# Patient Record
Sex: Female | Born: 1945 | Race: White | Hispanic: No | Smoking: Never smoker
Health system: Southern US, Community
[De-identification: ages and names within clinical notes are randomized; demographics above are authoritative.]

## PROBLEM LIST (undated history)

## (undated) DIAGNOSIS — G20 Parkinson's disease: Secondary | ICD-10-CM

## (undated) DIAGNOSIS — G20A1 Parkinson's disease without dyskinesia, without mention of fluctuations: Secondary | ICD-10-CM

## (undated) HISTORY — PX: MENISCUS REPAIR: SHX5179

## (undated) HISTORY — PX: TONSILLECTOMY: SUR1361

## (undated) HISTORY — PX: FOOT FRACTURE SURGERY: SHX645

## (undated) HISTORY — PX: LAMINECTOMY: SHX219

## (undated) HISTORY — PX: BACK SURGERY: SHX140

## (undated) HISTORY — PX: ROTATOR CUFF REPAIR: SHX139

## (undated) HISTORY — PX: ABDOMINAL HYSTERECTOMY: SHX81

---

## 1998-06-08 ENCOUNTER — Other Ambulatory Visit: Admission: RE | Admit: 1998-06-08 | Discharge: 1998-06-08 | Payer: Self-pay | Admitting: *Deleted

## 1999-07-14 ENCOUNTER — Other Ambulatory Visit: Admission: RE | Admit: 1999-07-14 | Discharge: 1999-07-14 | Payer: Self-pay | Admitting: *Deleted

## 2000-07-13 ENCOUNTER — Other Ambulatory Visit: Admission: RE | Admit: 2000-07-13 | Discharge: 2000-07-13 | Payer: Self-pay | Admitting: *Deleted

## 2001-09-06 ENCOUNTER — Other Ambulatory Visit: Admission: RE | Admit: 2001-09-06 | Discharge: 2001-09-06 | Payer: Self-pay | Admitting: Obstetrics and Gynecology

## 2003-04-15 ENCOUNTER — Other Ambulatory Visit: Admission: RE | Admit: 2003-04-15 | Discharge: 2003-04-15 | Payer: Self-pay | Admitting: Obstetrics and Gynecology

## 2009-05-11 ENCOUNTER — Emergency Department: Payer: Self-pay | Admitting: Emergency Medicine

## 2009-05-27 ENCOUNTER — Encounter: Admission: RE | Admit: 2009-05-27 | Discharge: 2009-05-27 | Payer: Self-pay | Admitting: Orthopedic Surgery

## 2011-06-08 ENCOUNTER — Other Ambulatory Visit: Payer: Self-pay | Admitting: Family Medicine

## 2011-06-08 ENCOUNTER — Ambulatory Visit
Admission: RE | Admit: 2011-06-08 | Discharge: 2011-06-08 | Disposition: A | Discharge status: Home / Self Care | Payer: Medicare Other | Source: Ambulatory Visit | Attending: Family Medicine | Admitting: Family Medicine

## 2011-06-08 DIAGNOSIS — R609 Edema, unspecified: Secondary | ICD-10-CM

## 2011-07-10 ENCOUNTER — Other Ambulatory Visit: Payer: Self-pay | Admitting: Family Medicine

## 2011-07-10 ENCOUNTER — Ambulatory Visit
Admission: RE | Admit: 2011-07-10 | Discharge: 2011-07-10 | Disposition: A | Discharge status: Home / Self Care | Payer: Medicare Other | Source: Ambulatory Visit | Attending: Family Medicine | Admitting: Family Medicine

## 2011-07-10 DIAGNOSIS — M79661 Pain in right lower leg: Secondary | ICD-10-CM

## 2012-04-24 ENCOUNTER — Ambulatory Visit: Payer: Medicare Other | Attending: Orthopedic Surgery | Admitting: Physical Therapy

## 2012-04-24 DIAGNOSIS — IMO0001 Reserved for inherently not codable concepts without codable children: Secondary | ICD-10-CM | POA: Insufficient documentation

## 2012-04-24 DIAGNOSIS — M25669 Stiffness of unspecified knee, not elsewhere classified: Secondary | ICD-10-CM | POA: Insufficient documentation

## 2012-04-24 DIAGNOSIS — R269 Unspecified abnormalities of gait and mobility: Secondary | ICD-10-CM | POA: Insufficient documentation

## 2012-04-24 DIAGNOSIS — M25569 Pain in unspecified knee: Secondary | ICD-10-CM | POA: Insufficient documentation

## 2012-04-29 ENCOUNTER — Ambulatory Visit: Payer: Medicare Other | Admitting: Physical Therapy

## 2012-05-02 ENCOUNTER — Ambulatory Visit: Payer: Medicare Other | Admitting: Physical Therapy

## 2012-05-06 ENCOUNTER — Ambulatory Visit: Payer: Medicare Other | Admitting: Physical Therapy

## 2012-05-10 ENCOUNTER — Ambulatory Visit: Payer: Medicare Other | Admitting: Physical Therapy

## 2012-05-14 ENCOUNTER — Ambulatory Visit: Payer: Medicare Other | Admitting: Physical Therapy

## 2012-05-16 ENCOUNTER — Ambulatory Visit: Payer: Medicare Other | Attending: Orthopedic Surgery | Admitting: Physical Therapy

## 2012-05-16 DIAGNOSIS — IMO0001 Reserved for inherently not codable concepts without codable children: Secondary | ICD-10-CM | POA: Insufficient documentation

## 2012-05-16 DIAGNOSIS — M25669 Stiffness of unspecified knee, not elsewhere classified: Secondary | ICD-10-CM | POA: Insufficient documentation

## 2012-05-16 DIAGNOSIS — M25569 Pain in unspecified knee: Secondary | ICD-10-CM | POA: Insufficient documentation

## 2012-05-16 DIAGNOSIS — R269 Unspecified abnormalities of gait and mobility: Secondary | ICD-10-CM | POA: Insufficient documentation

## 2012-05-21 ENCOUNTER — Ambulatory Visit: Payer: Medicare Other | Admitting: Physical Therapy

## 2012-05-23 ENCOUNTER — Ambulatory Visit: Payer: Medicare Other | Admitting: Physical Therapy

## 2012-05-28 ENCOUNTER — Ambulatory Visit: Payer: Medicare Other | Admitting: Physical Therapy

## 2012-05-30 ENCOUNTER — Ambulatory Visit: Payer: Medicare Other | Admitting: Physical Therapy

## 2012-06-04 ENCOUNTER — Ambulatory Visit: Payer: Medicare Other | Admitting: Physical Therapy

## 2012-06-06 ENCOUNTER — Ambulatory Visit: Payer: Medicare Other | Admitting: Physical Therapy

## 2012-06-11 ENCOUNTER — Ambulatory Visit: Payer: Medicare Other | Admitting: Physical Therapy

## 2012-06-13 ENCOUNTER — Ambulatory Visit: Payer: Medicare Other | Admitting: Physical Therapy

## 2012-06-18 ENCOUNTER — Ambulatory Visit: Payer: Medicare Other | Attending: Orthopedic Surgery | Admitting: Physical Therapy

## 2012-06-18 DIAGNOSIS — M25569 Pain in unspecified knee: Secondary | ICD-10-CM | POA: Insufficient documentation

## 2012-06-18 DIAGNOSIS — M25669 Stiffness of unspecified knee, not elsewhere classified: Secondary | ICD-10-CM | POA: Insufficient documentation

## 2012-06-18 DIAGNOSIS — IMO0001 Reserved for inherently not codable concepts without codable children: Secondary | ICD-10-CM | POA: Insufficient documentation

## 2012-06-18 DIAGNOSIS — R269 Unspecified abnormalities of gait and mobility: Secondary | ICD-10-CM | POA: Insufficient documentation

## 2012-06-20 ENCOUNTER — Ambulatory Visit: Payer: Medicare Other | Admitting: Physical Therapy

## 2012-06-24 ENCOUNTER — Ambulatory Visit: Payer: Medicare Other | Admitting: Physical Therapy

## 2012-06-27 ENCOUNTER — Ambulatory Visit: Payer: Medicare Other | Admitting: Physical Therapy

## 2014-01-28 DIAGNOSIS — H40019 Open angle with borderline findings, low risk, unspecified eye: Secondary | ICD-10-CM | POA: Diagnosis not present

## 2014-01-28 DIAGNOSIS — H43313 Vitreous membranes and strands, bilateral: Secondary | ICD-10-CM | POA: Diagnosis not present

## 2014-06-18 DIAGNOSIS — Z1231 Encounter for screening mammogram for malignant neoplasm of breast: Secondary | ICD-10-CM | POA: Diagnosis not present

## 2014-08-11 DIAGNOSIS — F329 Major depressive disorder, single episode, unspecified: Secondary | ICD-10-CM | POA: Diagnosis not present

## 2014-08-11 DIAGNOSIS — Z6832 Body mass index (BMI) 32.0-32.9, adult: Secondary | ICD-10-CM | POA: Diagnosis not present

## 2014-08-11 DIAGNOSIS — E669 Obesity, unspecified: Secondary | ICD-10-CM | POA: Diagnosis not present

## 2014-08-11 DIAGNOSIS — E785 Hyperlipidemia, unspecified: Secondary | ICD-10-CM | POA: Diagnosis not present

## 2014-08-11 DIAGNOSIS — G20 Parkinson's disease: Secondary | ICD-10-CM | POA: Diagnosis not present

## 2014-08-18 DIAGNOSIS — M1712 Unilateral primary osteoarthritis, left knee: Secondary | ICD-10-CM | POA: Diagnosis not present

## 2014-08-18 DIAGNOSIS — M19032 Primary osteoarthritis, left wrist: Secondary | ICD-10-CM | POA: Diagnosis not present

## 2014-09-10 DIAGNOSIS — D485 Neoplasm of uncertain behavior of skin: Secondary | ICD-10-CM | POA: Diagnosis not present

## 2014-09-10 DIAGNOSIS — Z85828 Personal history of other malignant neoplasm of skin: Secondary | ICD-10-CM | POA: Diagnosis not present

## 2014-09-10 DIAGNOSIS — C4492 Squamous cell carcinoma of skin, unspecified: Secondary | ICD-10-CM | POA: Diagnosis not present

## 2014-09-10 DIAGNOSIS — D225 Melanocytic nevi of trunk: Secondary | ICD-10-CM | POA: Diagnosis not present

## 2014-09-10 DIAGNOSIS — L821 Other seborrheic keratosis: Secondary | ICD-10-CM | POA: Diagnosis not present

## 2014-10-02 DIAGNOSIS — C44321 Squamous cell carcinoma of skin of nose: Secondary | ICD-10-CM | POA: Diagnosis not present

## 2014-10-17 DIAGNOSIS — R42 Dizziness and giddiness: Secondary | ICD-10-CM | POA: Diagnosis not present

## 2014-11-06 DIAGNOSIS — Z23 Encounter for immunization: Secondary | ICD-10-CM | POA: Diagnosis not present

## 2014-12-29 DIAGNOSIS — M25562 Pain in left knee: Secondary | ICD-10-CM | POA: Diagnosis not present

## 2014-12-29 DIAGNOSIS — M170 Bilateral primary osteoarthritis of knee: Secondary | ICD-10-CM | POA: Diagnosis not present

## 2014-12-29 DIAGNOSIS — R262 Difficulty in walking, not elsewhere classified: Secondary | ICD-10-CM | POA: Diagnosis not present

## 2014-12-29 DIAGNOSIS — M25561 Pain in right knee: Secondary | ICD-10-CM | POA: Diagnosis not present

## 2014-12-29 DIAGNOSIS — M19031 Primary osteoarthritis, right wrist: Secondary | ICD-10-CM | POA: Diagnosis not present

## 2015-01-05 DIAGNOSIS — M25561 Pain in right knee: Secondary | ICD-10-CM | POA: Diagnosis not present

## 2015-01-05 DIAGNOSIS — M25562 Pain in left knee: Secondary | ICD-10-CM | POA: Diagnosis not present

## 2015-01-05 DIAGNOSIS — M1712 Unilateral primary osteoarthritis, left knee: Secondary | ICD-10-CM | POA: Diagnosis not present

## 2015-01-05 DIAGNOSIS — M170 Bilateral primary osteoarthritis of knee: Secondary | ICD-10-CM | POA: Diagnosis not present

## 2015-01-05 DIAGNOSIS — R2689 Other abnormalities of gait and mobility: Secondary | ICD-10-CM | POA: Diagnosis not present

## 2015-01-07 DIAGNOSIS — M25562 Pain in left knee: Secondary | ICD-10-CM | POA: Diagnosis not present

## 2015-01-07 DIAGNOSIS — M170 Bilateral primary osteoarthritis of knee: Secondary | ICD-10-CM | POA: Diagnosis not present

## 2015-01-07 DIAGNOSIS — R2689 Other abnormalities of gait and mobility: Secondary | ICD-10-CM | POA: Diagnosis not present

## 2015-01-07 DIAGNOSIS — M1711 Unilateral primary osteoarthritis, right knee: Secondary | ICD-10-CM | POA: Diagnosis not present

## 2015-01-07 DIAGNOSIS — M25561 Pain in right knee: Secondary | ICD-10-CM | POA: Diagnosis not present

## 2015-01-20 DIAGNOSIS — M1712 Unilateral primary osteoarthritis, left knee: Secondary | ICD-10-CM | POA: Diagnosis not present

## 2015-01-20 DIAGNOSIS — M25562 Pain in left knee: Secondary | ICD-10-CM | POA: Diagnosis not present

## 2015-01-20 DIAGNOSIS — R2689 Other abnormalities of gait and mobility: Secondary | ICD-10-CM | POA: Diagnosis not present

## 2015-01-20 DIAGNOSIS — M25561 Pain in right knee: Secondary | ICD-10-CM | POA: Diagnosis not present

## 2015-01-20 DIAGNOSIS — M170 Bilateral primary osteoarthritis of knee: Secondary | ICD-10-CM | POA: Diagnosis not present

## 2015-02-02 DIAGNOSIS — M25562 Pain in left knee: Secondary | ICD-10-CM | POA: Diagnosis not present

## 2015-02-02 DIAGNOSIS — M1711 Unilateral primary osteoarthritis, right knee: Secondary | ICD-10-CM | POA: Diagnosis not present

## 2015-02-02 DIAGNOSIS — M170 Bilateral primary osteoarthritis of knee: Secondary | ICD-10-CM | POA: Diagnosis not present

## 2015-02-02 DIAGNOSIS — R2689 Other abnormalities of gait and mobility: Secondary | ICD-10-CM | POA: Diagnosis not present

## 2015-02-02 DIAGNOSIS — M25561 Pain in right knee: Secondary | ICD-10-CM | POA: Diagnosis not present

## 2015-02-04 DIAGNOSIS — M859 Disorder of bone density and structure, unspecified: Secondary | ICD-10-CM | POA: Diagnosis not present

## 2015-02-04 DIAGNOSIS — E7800 Pure hypercholesterolemia, unspecified: Secondary | ICD-10-CM | POA: Diagnosis not present

## 2015-02-04 DIAGNOSIS — Z23 Encounter for immunization: Secondary | ICD-10-CM | POA: Diagnosis not present

## 2015-02-04 DIAGNOSIS — Z209 Contact with and (suspected) exposure to unspecified communicable disease: Secondary | ICD-10-CM | POA: Diagnosis not present

## 2015-02-04 DIAGNOSIS — E669 Obesity, unspecified: Secondary | ICD-10-CM | POA: Diagnosis not present

## 2015-02-04 DIAGNOSIS — Z0000 Encounter for general adult medical examination without abnormal findings: Secondary | ICD-10-CM | POA: Diagnosis not present

## 2015-02-04 DIAGNOSIS — Z131 Encounter for screening for diabetes mellitus: Secondary | ICD-10-CM | POA: Diagnosis not present

## 2015-02-04 DIAGNOSIS — Z136 Encounter for screening for cardiovascular disorders: Secondary | ICD-10-CM | POA: Diagnosis not present

## 2015-02-04 DIAGNOSIS — G20 Parkinson's disease: Secondary | ICD-10-CM | POA: Diagnosis not present

## 2015-02-05 DIAGNOSIS — M25562 Pain in left knee: Secondary | ICD-10-CM | POA: Diagnosis not present

## 2015-02-05 DIAGNOSIS — M1712 Unilateral primary osteoarthritis, left knee: Secondary | ICD-10-CM | POA: Diagnosis not present

## 2015-02-05 DIAGNOSIS — M170 Bilateral primary osteoarthritis of knee: Secondary | ICD-10-CM | POA: Diagnosis not present

## 2015-02-05 DIAGNOSIS — M25561 Pain in right knee: Secondary | ICD-10-CM | POA: Diagnosis not present

## 2015-02-05 DIAGNOSIS — R2689 Other abnormalities of gait and mobility: Secondary | ICD-10-CM | POA: Diagnosis not present

## 2015-02-09 DIAGNOSIS — M25561 Pain in right knee: Secondary | ICD-10-CM | POA: Diagnosis not present

## 2015-02-09 DIAGNOSIS — R2689 Other abnormalities of gait and mobility: Secondary | ICD-10-CM | POA: Diagnosis not present

## 2015-02-09 DIAGNOSIS — M25562 Pain in left knee: Secondary | ICD-10-CM | POA: Diagnosis not present

## 2015-02-09 DIAGNOSIS — M170 Bilateral primary osteoarthritis of knee: Secondary | ICD-10-CM | POA: Diagnosis not present

## 2015-02-09 DIAGNOSIS — M1711 Unilateral primary osteoarthritis, right knee: Secondary | ICD-10-CM | POA: Diagnosis not present

## 2015-02-10 DIAGNOSIS — R635 Abnormal weight gain: Secondary | ICD-10-CM | POA: Diagnosis not present

## 2015-02-10 DIAGNOSIS — G20 Parkinson's disease: Secondary | ICD-10-CM | POA: Diagnosis not present

## 2015-02-10 DIAGNOSIS — G249 Dystonia, unspecified: Secondary | ICD-10-CM | POA: Diagnosis not present

## 2015-02-10 DIAGNOSIS — Z79899 Other long term (current) drug therapy: Secondary | ICD-10-CM | POA: Diagnosis not present

## 2015-02-10 DIAGNOSIS — F329 Major depressive disorder, single episode, unspecified: Secondary | ICD-10-CM | POA: Diagnosis not present

## 2015-02-17 DIAGNOSIS — M25562 Pain in left knee: Secondary | ICD-10-CM | POA: Diagnosis not present

## 2015-02-17 DIAGNOSIS — M25561 Pain in right knee: Secondary | ICD-10-CM | POA: Diagnosis not present

## 2015-02-17 DIAGNOSIS — R2689 Other abnormalities of gait and mobility: Secondary | ICD-10-CM | POA: Diagnosis not present

## 2015-02-17 DIAGNOSIS — M170 Bilateral primary osteoarthritis of knee: Secondary | ICD-10-CM | POA: Diagnosis not present

## 2015-02-17 DIAGNOSIS — M1712 Unilateral primary osteoarthritis, left knee: Secondary | ICD-10-CM | POA: Diagnosis not present

## 2015-02-23 DIAGNOSIS — M1711 Unilateral primary osteoarthritis, right knee: Secondary | ICD-10-CM | POA: Diagnosis not present

## 2015-02-23 DIAGNOSIS — R2689 Other abnormalities of gait and mobility: Secondary | ICD-10-CM | POA: Diagnosis not present

## 2015-02-23 DIAGNOSIS — M25561 Pain in right knee: Secondary | ICD-10-CM | POA: Diagnosis not present

## 2015-02-23 DIAGNOSIS — M25562 Pain in left knee: Secondary | ICD-10-CM | POA: Diagnosis not present

## 2015-02-23 DIAGNOSIS — M170 Bilateral primary osteoarthritis of knee: Secondary | ICD-10-CM | POA: Diagnosis not present

## 2015-03-08 DIAGNOSIS — H43313 Vitreous membranes and strands, bilateral: Secondary | ICD-10-CM | POA: Diagnosis not present

## 2015-03-08 DIAGNOSIS — H40019 Open angle with borderline findings, low risk, unspecified eye: Secondary | ICD-10-CM | POA: Diagnosis not present

## 2015-05-06 DIAGNOSIS — S46921A Laceration of unspecified muscle, fascia and tendon at shoulder and upper arm level, right arm, initial encounter: Secondary | ICD-10-CM | POA: Diagnosis not present

## 2015-05-06 DIAGNOSIS — R110 Nausea: Secondary | ICD-10-CM | POA: Diagnosis not present

## 2015-05-06 DIAGNOSIS — S41109A Unspecified open wound of unspecified upper arm, initial encounter: Secondary | ICD-10-CM | POA: Diagnosis not present

## 2015-05-06 DIAGNOSIS — G20 Parkinson's disease: Secondary | ICD-10-CM | POA: Diagnosis not present

## 2015-05-06 DIAGNOSIS — T148 Other injury of unspecified body region: Secondary | ICD-10-CM | POA: Diagnosis not present

## 2015-05-06 DIAGNOSIS — S41111A Laceration without foreign body of right upper arm, initial encounter: Secondary | ICD-10-CM | POA: Diagnosis not present

## 2015-05-10 DIAGNOSIS — Z23 Encounter for immunization: Secondary | ICD-10-CM | POA: Diagnosis not present

## 2015-05-10 DIAGNOSIS — S41111A Laceration without foreign body of right upper arm, initial encounter: Secondary | ICD-10-CM | POA: Diagnosis not present

## 2015-05-21 DIAGNOSIS — B379 Candidiasis, unspecified: Secondary | ICD-10-CM | POA: Diagnosis not present

## 2015-05-21 DIAGNOSIS — Z4802 Encounter for removal of sutures: Secondary | ICD-10-CM | POA: Diagnosis not present

## 2015-05-21 DIAGNOSIS — S41111D Laceration without foreign body of right upper arm, subsequent encounter: Secondary | ICD-10-CM | POA: Diagnosis not present

## 2015-06-02 DIAGNOSIS — H40019 Open angle with borderline findings, low risk, unspecified eye: Secondary | ICD-10-CM | POA: Diagnosis not present

## 2015-06-02 DIAGNOSIS — H43313 Vitreous membranes and strands, bilateral: Secondary | ICD-10-CM | POA: Diagnosis not present

## 2015-06-30 DIAGNOSIS — M85851 Other specified disorders of bone density and structure, right thigh: Secondary | ICD-10-CM | POA: Diagnosis not present

## 2015-06-30 DIAGNOSIS — Z1231 Encounter for screening mammogram for malignant neoplasm of breast: Secondary | ICD-10-CM | POA: Diagnosis not present

## 2015-06-30 DIAGNOSIS — M810 Age-related osteoporosis without current pathological fracture: Secondary | ICD-10-CM | POA: Diagnosis not present

## 2015-07-27 DIAGNOSIS — H43313 Vitreous membranes and strands, bilateral: Secondary | ICD-10-CM | POA: Diagnosis not present

## 2015-07-27 DIAGNOSIS — H40019 Open angle with borderline findings, low risk, unspecified eye: Secondary | ICD-10-CM | POA: Diagnosis not present

## 2015-08-14 DIAGNOSIS — L237 Allergic contact dermatitis due to plants, except food: Secondary | ICD-10-CM | POA: Diagnosis not present

## 2015-08-26 DIAGNOSIS — H40019 Open angle with borderline findings, low risk, unspecified eye: Secondary | ICD-10-CM | POA: Diagnosis not present

## 2015-08-26 DIAGNOSIS — H43313 Vitreous membranes and strands, bilateral: Secondary | ICD-10-CM | POA: Diagnosis not present

## 2015-09-23 DIAGNOSIS — M1711 Unilateral primary osteoarthritis, right knee: Secondary | ICD-10-CM | POA: Diagnosis not present

## 2015-10-06 DIAGNOSIS — H409 Unspecified glaucoma: Secondary | ICD-10-CM | POA: Diagnosis not present

## 2015-10-06 DIAGNOSIS — Z888 Allergy status to other drugs, medicaments and biological substances status: Secondary | ICD-10-CM | POA: Diagnosis not present

## 2015-10-06 DIAGNOSIS — G20 Parkinson's disease: Secondary | ICD-10-CM | POA: Diagnosis not present

## 2015-10-06 DIAGNOSIS — I10 Essential (primary) hypertension: Secondary | ICD-10-CM | POA: Diagnosis not present

## 2015-10-07 DIAGNOSIS — Z23 Encounter for immunization: Secondary | ICD-10-CM | POA: Diagnosis not present

## 2015-10-14 DIAGNOSIS — H40019 Open angle with borderline findings, low risk, unspecified eye: Secondary | ICD-10-CM | POA: Diagnosis not present

## 2015-12-01 DIAGNOSIS — H40019 Open angle with borderline findings, low risk, unspecified eye: Secondary | ICD-10-CM | POA: Diagnosis not present

## 2016-01-27 DIAGNOSIS — H43313 Vitreous membranes and strands, bilateral: Secondary | ICD-10-CM | POA: Diagnosis not present

## 2016-01-27 DIAGNOSIS — H40019 Open angle with borderline findings, low risk, unspecified eye: Secondary | ICD-10-CM | POA: Diagnosis not present

## 2016-02-24 DIAGNOSIS — H2513 Age-related nuclear cataract, bilateral: Secondary | ICD-10-CM | POA: Diagnosis not present

## 2016-02-24 DIAGNOSIS — H401131 Primary open-angle glaucoma, bilateral, mild stage: Secondary | ICD-10-CM | POA: Diagnosis not present

## 2016-02-24 DIAGNOSIS — H25013 Cortical age-related cataract, bilateral: Secondary | ICD-10-CM | POA: Diagnosis not present

## 2016-03-27 DIAGNOSIS — R413 Other amnesia: Secondary | ICD-10-CM | POA: Diagnosis not present

## 2016-03-27 DIAGNOSIS — E785 Hyperlipidemia, unspecified: Secondary | ICD-10-CM | POA: Diagnosis not present

## 2016-03-27 DIAGNOSIS — W19XXXA Unspecified fall, initial encounter: Secondary | ICD-10-CM | POA: Diagnosis not present

## 2016-03-27 DIAGNOSIS — Z79899 Other long term (current) drug therapy: Secondary | ICD-10-CM | POA: Diagnosis not present

## 2016-03-27 DIAGNOSIS — G20 Parkinson's disease: Secondary | ICD-10-CM | POA: Diagnosis not present

## 2016-03-27 DIAGNOSIS — I10 Essential (primary) hypertension: Secondary | ICD-10-CM | POA: Diagnosis not present

## 2016-03-27 DIAGNOSIS — E669 Obesity, unspecified: Secondary | ICD-10-CM | POA: Diagnosis not present

## 2016-05-25 DIAGNOSIS — H401131 Primary open-angle glaucoma, bilateral, mild stage: Secondary | ICD-10-CM | POA: Diagnosis not present

## 2016-07-25 DIAGNOSIS — Z0000 Encounter for general adult medical examination without abnormal findings: Secondary | ICD-10-CM | POA: Diagnosis not present

## 2016-07-25 DIAGNOSIS — R7309 Other abnormal glucose: Secondary | ICD-10-CM | POA: Diagnosis not present

## 2016-08-02 DIAGNOSIS — M859 Disorder of bone density and structure, unspecified: Secondary | ICD-10-CM | POA: Diagnosis not present

## 2016-08-02 DIAGNOSIS — E669 Obesity, unspecified: Secondary | ICD-10-CM | POA: Diagnosis not present

## 2016-08-02 DIAGNOSIS — G20 Parkinson's disease: Secondary | ICD-10-CM | POA: Diagnosis not present

## 2016-08-02 DIAGNOSIS — Z0000 Encounter for general adult medical examination without abnormal findings: Secondary | ICD-10-CM | POA: Diagnosis not present

## 2016-08-02 DIAGNOSIS — Z6831 Body mass index (BMI) 31.0-31.9, adult: Secondary | ICD-10-CM | POA: Diagnosis not present

## 2016-08-02 DIAGNOSIS — R7303 Prediabetes: Secondary | ICD-10-CM | POA: Diagnosis not present

## 2016-08-08 DIAGNOSIS — G20 Parkinson's disease: Secondary | ICD-10-CM | POA: Diagnosis not present

## 2016-08-08 DIAGNOSIS — Z79899 Other long term (current) drug therapy: Secondary | ICD-10-CM | POA: Diagnosis not present

## 2016-10-05 DIAGNOSIS — Z1231 Encounter for screening mammogram for malignant neoplasm of breast: Secondary | ICD-10-CM | POA: Diagnosis not present

## 2016-10-10 DIAGNOSIS — Z23 Encounter for immunization: Secondary | ICD-10-CM | POA: Diagnosis not present

## 2016-10-17 DIAGNOSIS — R258 Other abnormal involuntary movements: Secondary | ICD-10-CM | POA: Diagnosis not present

## 2016-10-17 DIAGNOSIS — G20 Parkinson's disease: Secondary | ICD-10-CM | POA: Diagnosis not present

## 2016-10-17 DIAGNOSIS — R5383 Other fatigue: Secondary | ICD-10-CM | POA: Diagnosis not present

## 2016-10-17 DIAGNOSIS — Z79899 Other long term (current) drug therapy: Secondary | ICD-10-CM | POA: Diagnosis not present

## 2016-11-03 ENCOUNTER — Emergency Department (HOSPITAL_BASED_OUTPATIENT_CLINIC_OR_DEPARTMENT_OTHER)
Admission: EM | Admit: 2016-11-03 | Discharge: 2016-11-03 | Disposition: A | Discharge status: Home / Self Care | Payer: Medicare Other | Attending: Emergency Medicine | Admitting: Emergency Medicine

## 2016-11-03 ENCOUNTER — Encounter (HOSPITAL_BASED_OUTPATIENT_CLINIC_OR_DEPARTMENT_OTHER): Payer: Self-pay | Admitting: *Deleted

## 2016-11-03 DIAGNOSIS — Z79899 Other long term (current) drug therapy: Secondary | ICD-10-CM | POA: Insufficient documentation

## 2016-11-03 DIAGNOSIS — I160 Hypertensive urgency: Secondary | ICD-10-CM | POA: Insufficient documentation

## 2016-11-03 DIAGNOSIS — R232 Flushing: Secondary | ICD-10-CM | POA: Diagnosis not present

## 2016-11-03 DIAGNOSIS — G20 Parkinson's disease: Secondary | ICD-10-CM | POA: Diagnosis not present

## 2016-11-03 DIAGNOSIS — I10 Essential (primary) hypertension: Secondary | ICD-10-CM | POA: Diagnosis present

## 2016-11-03 HISTORY — DX: Parkinson's disease without dyskinesia, without mention of fluctuations: G20.A1

## 2016-11-03 HISTORY — DX: Parkinson's disease: G20

## 2016-11-03 NOTE — Discharge Instructions (Signed)
We saw you in the ER for facial flushing and elevated blood pressure. We suspect that the elevated blood pressure was anxiety provoked.  Facial flushing can have several causes from medical conditions, to medications or even food ingestion. If the symptoms recur or become constant -it should be checked by your primary doctor.

## 2016-11-03 NOTE — ED Triage Notes (Signed)
States her BP was high at home. States her face is red and feels hot. Appears anxious. She does not take BP medication due to Parkinsons medication causing her BP to be low normally.

## 2016-11-04 NOTE — ED Provider Notes (Signed)
Brooke Frazier   CSN: [id number] Arrival date & time: 11/03/16  2039     History   Chief Complaint Chief Complaint  Patient presents with  . Hypertension    HPI Brooke Frazier is a 71 y.o. female.  HPI  71 year old female with history of Parkinson's disease comes in with chief complaint of high blood pressure and flushing of her face. Patient reports that about 2 hours after eating dinner she noticed flushing to her face when she looked into the mirror. She is unsure when the flushing started. Patient got a little nervous, checked her blood pressure and noted it was elevated so she decided to come to the emergency room. Patient feels a lot better now. At no point did patient have any chest pain, shortness of breath, headaches, vision changes, numbness, tingling, dizziness. Patient reports that she has Parkinson's and takes medications that typically keep her blood pressure low. She however does have anxiety and suffers from white coat syndrome, and therefore has had frequent documented elevated blood pressure. Patient's anxiety is well known and she is not on anti-hypertensives. Patient also reports that she has had flushing to her face in the past with certain food items.  Past Medical History:  Diagnosis Date  . Parkinson disease ([place])     There are no active problems to display for this patient.   Past Surgical History:  Procedure Laterality Date  . ABDOMINAL HYSTERECTOMY    . BACK SURGERY    . FOOT FRACTURE SURGERY    . LAMINECTOMY    . MENISCUS REPAIR    . ROTATOR CUFF REPAIR    . TONSILLECTOMY      OB History    No data available       Home Medications    Prior to Admission medications   Medication Sig Start Date End Date Taking? Authorizing Provider  amantadine (SYMMETREL) 100 MG capsule Take 100 mg by mouth 2 (two) times daily.   Yes [provider]  carbidopa-levodopa (SINEMET IR) 25-100 MG tablet  Take 1 tablet by mouth 3 (three) times daily.   Yes [provider]  MELOXICAM PO Take by mouth.   Yes [provider]  RANITIDINE HCL PO Take by mouth.   Yes [provider]    Family History No family history on file.  Social History Social History  Substance Use Topics  . Smoking status: Never Smoker  . Smokeless tobacco: Never Used  . Alcohol use No     Allergies   Patient has no known allergies.   Review of Systems Review of Systems  Constitutional: Negative for fever.  HENT: Negative for facial swelling.   Eyes: Negative for visual disturbance.  Respiratory: Negative for shortness of breath.   Cardiovascular: Negative for chest pain.  Gastrointestinal: Negative for nausea.  Skin: Negative for wound.  Neurological: Positive for tremors. Negative for dizziness, seizures, weakness, light-headedness, numbness and headaches.     Physical Exam Updated Vital Signs BP (!) 169/95   Pulse 80   Temp 97.6 F (36.4 C) (Oral)   Resp 14   Ht 5\' 4"  (1.626 m)   Wt 80.7 kg (178 lb)   SpO2 100%   BMI 30.55 kg/m   Physical Exam  Constitutional: She is oriented to person, place, and time. She appears well-developed.  HENT:  Head: Normocephalic and atraumatic.  Eyes: EOM are normal.  Neck: Normal range of motion. Neck supple.  Cardiovascular: Normal rate and  intact distal pulses.   Pulmonary/Chest: Effort normal. No respiratory distress. She has no wheezes. She has no rales.  Abdominal: Bowel sounds are normal.  Neurological: She is alert and oriented to person, place, and time. No cranial nerve deficit.  Skin: Skin is warm and dry.  Nursing Frazier and vitals reviewed.    ED Treatments / Results  Labs (all labs ordered are listed, but only abnormal results are displayed) Labs Reviewed - No data to display  EKG  EKG Interpretation  Date/Time:  Friday November 03 2016 20:53:31 EDT Ventricular Rate:  104 PR Interval:  178 QRS  Duration: 86 QT Interval:  368 QTC Calculation: 483 R Axis:   68 Text Interpretation:  Sinus tachycardia Otherwise normal ECG No acute changes No old tracing to compare Confirmed by Varney Biles [phone]) on 11/03/2016 11:16:32 PM       Radiology No results found.  Procedures Procedures (including critical care time)  Medications Ordered in ED Medications - No data to display   Initial Impression / Assessment and Plan / ED Course  I have reviewed the triage vital signs and the nursing notes.  Pertinent labs & imaging results that were available during my care of the patient were reviewed by me and considered in my medical decision making (see chart for details).     Patient comes in with chief complaint of facial flushing and elevated blood pressure.  Patient had blood pressure in the 144Y systolic arrival She has history of anxiety and over the past 2 or 3 hours her blood pressure has gradually decreased to 160s sbp. Patient had no symptoms of end organ damage with her elevated blood pressure and the EKG is reassuring. No further workup is required from the hypertension perspective. Patient has been advised to see her primary care doctor if the elevation in blood pressure persists. Strict ER return precautions have been discussed, and patient is agreeing with the plan and is comfortable with the workup done and the recommendations from the ER.  Patient also noted flushing to her face. She has had flushing in the past with certain food ingestions. Currently the flushing has resolved. Flushing in the face could be secondary to medications, medical conditions, food ingestion, paraneoplastic processes - however it appears that in this case it is likely due to food ingestion. We have advised patient's to keep an eye on her symptom and if hey recur frequently then she will need her primary care doctor to evaluate.  Final Clinical Impressions(s) / ED Diagnoses   Final diagnoses:   Hypertensive urgency  Facial flushing    New Prescriptions Discharge Medication List as of 11/03/2016 11:27 PM       Varney Biles, MD 11/04/16 0041

## 2016-12-11 DIAGNOSIS — Z85828 Personal history of other malignant neoplasm of skin: Secondary | ICD-10-CM | POA: Diagnosis not present

## 2016-12-11 DIAGNOSIS — D225 Melanocytic nevi of trunk: Secondary | ICD-10-CM | POA: Diagnosis not present

## 2016-12-11 DIAGNOSIS — Z23 Encounter for immunization: Secondary | ICD-10-CM | POA: Diagnosis not present

## 2016-12-11 DIAGNOSIS — L814 Other melanin hyperpigmentation: Secondary | ICD-10-CM | POA: Diagnosis not present

## 2016-12-11 DIAGNOSIS — L821 Other seborrheic keratosis: Secondary | ICD-10-CM | POA: Diagnosis not present

## 2016-12-11 DIAGNOSIS — D1801 Hemangioma of skin and subcutaneous tissue: Secondary | ICD-10-CM | POA: Diagnosis not present

## 2016-12-18 DIAGNOSIS — H401131 Primary open-angle glaucoma, bilateral, mild stage: Secondary | ICD-10-CM | POA: Diagnosis not present

## 2017-03-21 DIAGNOSIS — M1710 Unilateral primary osteoarthritis, unspecified knee: Secondary | ICD-10-CM | POA: Diagnosis not present

## 2017-03-23 DIAGNOSIS — H02839 Dermatochalasis of unspecified eye, unspecified eyelid: Secondary | ICD-10-CM | POA: Diagnosis not present

## 2017-03-23 DIAGNOSIS — H25013 Cortical age-related cataract, bilateral: Secondary | ICD-10-CM | POA: Diagnosis not present

## 2017-03-23 DIAGNOSIS — H401132 Primary open-angle glaucoma, bilateral, moderate stage: Secondary | ICD-10-CM | POA: Diagnosis not present

## 2017-03-23 DIAGNOSIS — H25043 Posterior subcapsular polar age-related cataract, bilateral: Secondary | ICD-10-CM | POA: Diagnosis not present

## 2017-03-23 DIAGNOSIS — H2513 Age-related nuclear cataract, bilateral: Secondary | ICD-10-CM | POA: Diagnosis not present

## 2017-04-11 DIAGNOSIS — H01025 Squamous blepharitis left lower eyelid: Secondary | ICD-10-CM | POA: Diagnosis not present

## 2017-04-11 DIAGNOSIS — H401132 Primary open-angle glaucoma, bilateral, moderate stage: Secondary | ICD-10-CM | POA: Diagnosis not present

## 2017-04-11 DIAGNOSIS — H04123 Dry eye syndrome of bilateral lacrimal glands: Secondary | ICD-10-CM | POA: Diagnosis not present

## 2017-04-11 DIAGNOSIS — H01022 Squamous blepharitis right lower eyelid: Secondary | ICD-10-CM | POA: Diagnosis not present

## 2017-04-11 DIAGNOSIS — H01021 Squamous blepharitis right upper eyelid: Secondary | ICD-10-CM | POA: Diagnosis not present

## 2017-04-11 DIAGNOSIS — H01024 Squamous blepharitis left upper eyelid: Secondary | ICD-10-CM | POA: Diagnosis not present

## 2017-04-11 DIAGNOSIS — H2513 Age-related nuclear cataract, bilateral: Secondary | ICD-10-CM | POA: Diagnosis not present

## 2017-05-17 DIAGNOSIS — H401112 Primary open-angle glaucoma, right eye, moderate stage: Secondary | ICD-10-CM | POA: Diagnosis not present

## 2017-05-17 DIAGNOSIS — H2511 Age-related nuclear cataract, right eye: Secondary | ICD-10-CM | POA: Diagnosis not present

## 2017-06-07 DIAGNOSIS — G20 Parkinson's disease: Secondary | ICD-10-CM | POA: Diagnosis not present

## 2017-06-07 DIAGNOSIS — Z79899 Other long term (current) drug therapy: Secondary | ICD-10-CM | POA: Diagnosis not present

## 2017-06-26 DIAGNOSIS — H2512 Age-related nuclear cataract, left eye: Secondary | ICD-10-CM | POA: Diagnosis not present

## 2017-06-28 DIAGNOSIS — H401122 Primary open-angle glaucoma, left eye, moderate stage: Secondary | ICD-10-CM | POA: Diagnosis not present

## 2017-06-28 DIAGNOSIS — H2512 Age-related nuclear cataract, left eye: Secondary | ICD-10-CM | POA: Diagnosis not present

## 2017-11-01 DIAGNOSIS — Z23 Encounter for immunization: Secondary | ICD-10-CM | POA: Diagnosis not present

## 2017-11-21 DIAGNOSIS — Z79899 Other long term (current) drug therapy: Secondary | ICD-10-CM | POA: Diagnosis not present

## 2017-11-21 DIAGNOSIS — G20 Parkinson's disease: Secondary | ICD-10-CM | POA: Diagnosis not present

## 2017-11-21 DIAGNOSIS — R030 Elevated blood-pressure reading, without diagnosis of hypertension: Secondary | ICD-10-CM | POA: Diagnosis not present

## 2017-12-18 DIAGNOSIS — H401132 Primary open-angle glaucoma, bilateral, moderate stage: Secondary | ICD-10-CM | POA: Diagnosis not present

## 2017-12-18 DIAGNOSIS — H04123 Dry eye syndrome of bilateral lacrimal glands: Secondary | ICD-10-CM | POA: Diagnosis not present

## 2017-12-18 DIAGNOSIS — H01025 Squamous blepharitis left lower eyelid: Secondary | ICD-10-CM | POA: Diagnosis not present

## 2017-12-18 DIAGNOSIS — H01022 Squamous blepharitis right lower eyelid: Secondary | ICD-10-CM | POA: Diagnosis not present

## 2017-12-18 DIAGNOSIS — H01024 Squamous blepharitis left upper eyelid: Secondary | ICD-10-CM | POA: Diagnosis not present

## 2017-12-18 DIAGNOSIS — H01021 Squamous blepharitis right upper eyelid: Secondary | ICD-10-CM | POA: Diagnosis not present

## 2017-12-18 DIAGNOSIS — H2513 Age-related nuclear cataract, bilateral: Secondary | ICD-10-CM | POA: Diagnosis not present

## 2017-12-18 DIAGNOSIS — Z961 Presence of intraocular lens: Secondary | ICD-10-CM | POA: Diagnosis not present

## 2020-01-27 DIAGNOSIS — H16223 Keratoconjunctivitis sicca, not specified as Sjogren's, bilateral: Secondary | ICD-10-CM | POA: Diagnosis not present

## 2020-01-27 DIAGNOSIS — Z961 Presence of intraocular lens: Secondary | ICD-10-CM | POA: Diagnosis not present

## 2020-01-27 DIAGNOSIS — H0102B Squamous blepharitis left eye, upper and lower eyelids: Secondary | ICD-10-CM | POA: Diagnosis not present

## 2020-01-27 DIAGNOSIS — H0102A Squamous blepharitis right eye, upper and lower eyelids: Secondary | ICD-10-CM | POA: Diagnosis not present

## 2020-01-27 DIAGNOSIS — H401132 Primary open-angle glaucoma, bilateral, moderate stage: Secondary | ICD-10-CM | POA: Diagnosis not present

## 2020-02-25 DIAGNOSIS — Z888 Allergy status to other drugs, medicaments and biological substances status: Secondary | ICD-10-CM | POA: Diagnosis not present

## 2020-02-25 DIAGNOSIS — G20 Parkinson's disease: Secondary | ICD-10-CM | POA: Diagnosis not present

## 2020-06-15 DIAGNOSIS — H16223 Keratoconjunctivitis sicca, not specified as Sjogren's, bilateral: Secondary | ICD-10-CM | POA: Diagnosis not present

## 2020-06-15 DIAGNOSIS — H0102A Squamous blepharitis right eye, upper and lower eyelids: Secondary | ICD-10-CM | POA: Diagnosis not present

## 2020-06-15 DIAGNOSIS — Z961 Presence of intraocular lens: Secondary | ICD-10-CM | POA: Diagnosis not present

## 2020-06-15 DIAGNOSIS — H0102B Squamous blepharitis left eye, upper and lower eyelids: Secondary | ICD-10-CM | POA: Diagnosis not present

## 2020-06-15 DIAGNOSIS — H401132 Primary open-angle glaucoma, bilateral, moderate stage: Secondary | ICD-10-CM | POA: Diagnosis not present

## 2020-08-18 DIAGNOSIS — G20 Parkinson's disease: Secondary | ICD-10-CM | POA: Diagnosis not present

## 2020-08-18 DIAGNOSIS — G3184 Mild cognitive impairment, so stated: Secondary | ICD-10-CM | POA: Diagnosis not present

## 2020-08-26 DIAGNOSIS — Z79899 Other long term (current) drug therapy: Secondary | ICD-10-CM | POA: Diagnosis not present

## 2020-08-26 DIAGNOSIS — G3184 Mild cognitive impairment, so stated: Secondary | ICD-10-CM | POA: Diagnosis not present

## 2020-08-26 DIAGNOSIS — G20 Parkinson's disease: Secondary | ICD-10-CM | POA: Diagnosis not present

## 2020-08-26 DIAGNOSIS — F32A Depression, unspecified: Secondary | ICD-10-CM | POA: Diagnosis not present

## 2020-08-26 DIAGNOSIS — R5383 Other fatigue: Secondary | ICD-10-CM | POA: Diagnosis not present

## 2020-09-07 DIAGNOSIS — G20 Parkinson's disease: Secondary | ICD-10-CM | POA: Diagnosis not present

## 2020-09-07 DIAGNOSIS — R5383 Other fatigue: Secondary | ICD-10-CM | POA: Diagnosis not present

## 2020-09-07 DIAGNOSIS — M859 Disorder of bone density and structure, unspecified: Secondary | ICD-10-CM | POA: Diagnosis not present

## 2020-09-21 DIAGNOSIS — Z0000 Encounter for general adult medical examination without abnormal findings: Secondary | ICD-10-CM | POA: Diagnosis not present

## 2020-10-05 DIAGNOSIS — M25511 Pain in right shoulder: Secondary | ICD-10-CM | POA: Diagnosis not present

## 2020-10-05 DIAGNOSIS — M25562 Pain in left knee: Secondary | ICD-10-CM | POA: Diagnosis not present

## 2020-10-20 DIAGNOSIS — G20 Parkinson's disease: Secondary | ICD-10-CM | POA: Diagnosis not present

## 2020-10-20 DIAGNOSIS — G3184 Mild cognitive impairment, so stated: Secondary | ICD-10-CM | POA: Diagnosis not present

## 2020-10-26 DIAGNOSIS — M85851 Other specified disorders of bone density and structure, right thigh: Secondary | ICD-10-CM | POA: Diagnosis not present

## 2020-10-26 DIAGNOSIS — Z1231 Encounter for screening mammogram for malignant neoplasm of breast: Secondary | ICD-10-CM | POA: Diagnosis not present

## 2020-10-26 DIAGNOSIS — Z780 Asymptomatic menopausal state: Secondary | ICD-10-CM | POA: Diagnosis not present

## 2020-10-26 DIAGNOSIS — M85852 Other specified disorders of bone density and structure, left thigh: Secondary | ICD-10-CM | POA: Diagnosis not present

## 2020-11-01 ENCOUNTER — Other Ambulatory Visit: Payer: Self-pay | Admitting: Sports Medicine

## 2020-11-01 ENCOUNTER — Ambulatory Visit
Admission: RE | Admit: 2020-11-01 | Discharge: 2020-11-01 | Disposition: A | Discharge status: Home / Self Care | Payer: Medicare Other | Source: Ambulatory Visit | Attending: Sports Medicine | Admitting: Sports Medicine

## 2020-11-01 DIAGNOSIS — M25562 Pain in left knee: Secondary | ICD-10-CM

## 2020-11-01 DIAGNOSIS — M25511 Pain in right shoulder: Secondary | ICD-10-CM | POA: Diagnosis not present

## 2020-12-06 DIAGNOSIS — H0102B Squamous blepharitis left eye, upper and lower eyelids: Secondary | ICD-10-CM | POA: Diagnosis not present

## 2020-12-06 DIAGNOSIS — Z961 Presence of intraocular lens: Secondary | ICD-10-CM | POA: Diagnosis not present

## 2020-12-06 DIAGNOSIS — H16223 Keratoconjunctivitis sicca, not specified as Sjogren's, bilateral: Secondary | ICD-10-CM | POA: Diagnosis not present

## 2020-12-06 DIAGNOSIS — H401132 Primary open-angle glaucoma, bilateral, moderate stage: Secondary | ICD-10-CM | POA: Diagnosis not present

## 2020-12-06 DIAGNOSIS — H0102A Squamous blepharitis right eye, upper and lower eyelids: Secondary | ICD-10-CM | POA: Diagnosis not present

## 2021-02-24 DIAGNOSIS — G20 Parkinson's disease: Secondary | ICD-10-CM | POA: Diagnosis not present

## 2021-03-03 DIAGNOSIS — M79642 Pain in left hand: Secondary | ICD-10-CM | POA: Diagnosis not present

## 2021-03-03 DIAGNOSIS — S52572A Other intraarticular fracture of lower end of left radius, initial encounter for closed fracture: Secondary | ICD-10-CM | POA: Diagnosis not present

## 2021-03-03 DIAGNOSIS — G3184 Mild cognitive impairment, so stated: Secondary | ICD-10-CM | POA: Diagnosis not present

## 2021-03-03 DIAGNOSIS — R569 Unspecified convulsions: Secondary | ICD-10-CM | POA: Diagnosis not present

## 2021-03-03 DIAGNOSIS — E785 Hyperlipidemia, unspecified: Secondary | ICD-10-CM | POA: Diagnosis not present

## 2021-03-03 DIAGNOSIS — F32A Depression, unspecified: Secondary | ICD-10-CM | POA: Diagnosis not present

## 2021-03-03 DIAGNOSIS — S52502A Unspecified fracture of the lower end of left radius, initial encounter for closed fracture: Secondary | ICD-10-CM | POA: Diagnosis not present

## 2021-03-03 DIAGNOSIS — M19042 Primary osteoarthritis, left hand: Secondary | ICD-10-CM | POA: Diagnosis not present

## 2021-03-03 DIAGNOSIS — G20 Parkinson's disease: Secondary | ICD-10-CM | POA: Diagnosis not present

## 2021-03-04 DIAGNOSIS — S52502A Unspecified fracture of the lower end of left radius, initial encounter for closed fracture: Secondary | ICD-10-CM | POA: Diagnosis not present

## 2021-03-11 DIAGNOSIS — F32A Depression, unspecified: Secondary | ICD-10-CM | POA: Diagnosis not present

## 2021-03-11 DIAGNOSIS — M25569 Pain in unspecified knee: Secondary | ICD-10-CM | POA: Diagnosis not present

## 2021-03-11 DIAGNOSIS — S52502D Unspecified fracture of the lower end of left radius, subsequent encounter for closed fracture with routine healing: Secondary | ICD-10-CM | POA: Diagnosis not present

## 2021-03-11 DIAGNOSIS — Z9682 Presence of neurostimulator: Secondary | ICD-10-CM | POA: Diagnosis not present

## 2021-03-11 DIAGNOSIS — M25529 Pain in unspecified elbow: Secondary | ICD-10-CM | POA: Diagnosis not present

## 2021-03-11 DIAGNOSIS — G20 Parkinson's disease: Secondary | ICD-10-CM | POA: Diagnosis not present

## 2021-03-11 DIAGNOSIS — M1990 Unspecified osteoarthritis, unspecified site: Secondary | ICD-10-CM | POA: Diagnosis not present

## 2021-03-11 DIAGNOSIS — G3184 Mild cognitive impairment, so stated: Secondary | ICD-10-CM | POA: Diagnosis not present

## 2021-03-11 DIAGNOSIS — Z48811 Encounter for surgical aftercare following surgery on the nervous system: Secondary | ICD-10-CM | POA: Diagnosis not present

## 2021-03-11 DIAGNOSIS — S52501B Unspecified fracture of the lower end of right radius, initial encounter for open fracture type I or II: Secondary | ICD-10-CM | POA: Diagnosis not present

## 2021-03-15 DIAGNOSIS — Z9682 Presence of neurostimulator: Secondary | ICD-10-CM | POA: Diagnosis not present

## 2021-03-15 DIAGNOSIS — S52501B Unspecified fracture of the lower end of right radius, initial encounter for open fracture type I or II: Secondary | ICD-10-CM | POA: Diagnosis not present

## 2021-03-15 DIAGNOSIS — G20 Parkinson's disease: Secondary | ICD-10-CM | POA: Diagnosis not present

## 2021-03-15 DIAGNOSIS — M25569 Pain in unspecified knee: Secondary | ICD-10-CM | POA: Diagnosis not present

## 2021-03-15 DIAGNOSIS — M25529 Pain in unspecified elbow: Secondary | ICD-10-CM | POA: Diagnosis not present

## 2021-03-15 DIAGNOSIS — Z48811 Encounter for surgical aftercare following surgery on the nervous system: Secondary | ICD-10-CM | POA: Diagnosis not present

## 2021-03-15 DIAGNOSIS — S52502D Unspecified fracture of the lower end of left radius, subsequent encounter for closed fracture with routine healing: Secondary | ICD-10-CM | POA: Diagnosis not present

## 2021-03-15 DIAGNOSIS — F32A Depression, unspecified: Secondary | ICD-10-CM | POA: Diagnosis not present

## 2021-03-15 DIAGNOSIS — G3184 Mild cognitive impairment, so stated: Secondary | ICD-10-CM | POA: Diagnosis not present

## 2021-03-15 DIAGNOSIS — M1990 Unspecified osteoarthritis, unspecified site: Secondary | ICD-10-CM | POA: Diagnosis not present

## 2021-03-16 DIAGNOSIS — Z9682 Presence of neurostimulator: Secondary | ICD-10-CM | POA: Diagnosis not present

## 2021-03-16 DIAGNOSIS — G3184 Mild cognitive impairment, so stated: Secondary | ICD-10-CM | POA: Diagnosis not present

## 2021-03-16 DIAGNOSIS — F32A Depression, unspecified: Secondary | ICD-10-CM | POA: Diagnosis not present

## 2021-03-16 DIAGNOSIS — S52502D Unspecified fracture of the lower end of left radius, subsequent encounter for closed fracture with routine healing: Secondary | ICD-10-CM | POA: Diagnosis not present

## 2021-03-16 DIAGNOSIS — M25529 Pain in unspecified elbow: Secondary | ICD-10-CM | POA: Diagnosis not present

## 2021-03-16 DIAGNOSIS — G20 Parkinson's disease: Secondary | ICD-10-CM | POA: Diagnosis not present

## 2021-03-16 DIAGNOSIS — Z48811 Encounter for surgical aftercare following surgery on the nervous system: Secondary | ICD-10-CM | POA: Diagnosis not present

## 2021-03-16 DIAGNOSIS — M25569 Pain in unspecified knee: Secondary | ICD-10-CM | POA: Diagnosis not present

## 2021-03-16 DIAGNOSIS — S52501B Unspecified fracture of the lower end of right radius, initial encounter for open fracture type I or II: Secondary | ICD-10-CM | POA: Diagnosis not present

## 2021-03-16 DIAGNOSIS — M1990 Unspecified osteoarthritis, unspecified site: Secondary | ICD-10-CM | POA: Diagnosis not present

## 2021-03-18 DIAGNOSIS — Z9682 Presence of neurostimulator: Secondary | ICD-10-CM | POA: Diagnosis not present

## 2021-03-18 DIAGNOSIS — S62102A Fracture of unspecified carpal bone, left wrist, initial encounter for closed fracture: Secondary | ICD-10-CM | POA: Diagnosis not present

## 2021-03-18 DIAGNOSIS — Z48811 Encounter for surgical aftercare following surgery on the nervous system: Secondary | ICD-10-CM | POA: Diagnosis not present

## 2021-03-18 DIAGNOSIS — S52502D Unspecified fracture of the lower end of left radius, subsequent encounter for closed fracture with routine healing: Secondary | ICD-10-CM | POA: Diagnosis not present

## 2021-03-21 DIAGNOSIS — G20 Parkinson's disease: Secondary | ICD-10-CM | POA: Diagnosis not present

## 2021-03-21 DIAGNOSIS — S52352D Displaced comminuted fracture of shaft of radius, left arm, subsequent encounter for closed fracture with routine healing: Secondary | ICD-10-CM | POA: Diagnosis not present

## 2021-03-22 DIAGNOSIS — S52502D Unspecified fracture of the lower end of left radius, subsequent encounter for closed fracture with routine healing: Secondary | ICD-10-CM | POA: Diagnosis not present

## 2021-03-22 DIAGNOSIS — Z48811 Encounter for surgical aftercare following surgery on the nervous system: Secondary | ICD-10-CM | POA: Diagnosis not present

## 2021-03-22 DIAGNOSIS — M25529 Pain in unspecified elbow: Secondary | ICD-10-CM | POA: Diagnosis not present

## 2021-03-22 DIAGNOSIS — M1990 Unspecified osteoarthritis, unspecified site: Secondary | ICD-10-CM | POA: Diagnosis not present

## 2021-03-22 DIAGNOSIS — G20 Parkinson's disease: Secondary | ICD-10-CM | POA: Diagnosis not present

## 2021-03-22 DIAGNOSIS — S52501B Unspecified fracture of the lower end of right radius, initial encounter for open fracture type I or II: Secondary | ICD-10-CM | POA: Diagnosis not present

## 2021-03-22 DIAGNOSIS — F32A Depression, unspecified: Secondary | ICD-10-CM | POA: Diagnosis not present

## 2021-03-22 DIAGNOSIS — G3184 Mild cognitive impairment, so stated: Secondary | ICD-10-CM | POA: Diagnosis not present

## 2021-03-22 DIAGNOSIS — Z9682 Presence of neurostimulator: Secondary | ICD-10-CM | POA: Diagnosis not present

## 2021-03-22 DIAGNOSIS — M25569 Pain in unspecified knee: Secondary | ICD-10-CM | POA: Diagnosis not present

## 2021-03-23 DIAGNOSIS — G3184 Mild cognitive impairment, so stated: Secondary | ICD-10-CM | POA: Diagnosis not present

## 2021-03-23 DIAGNOSIS — S52501B Unspecified fracture of the lower end of right radius, initial encounter for open fracture type I or II: Secondary | ICD-10-CM | POA: Diagnosis not present

## 2021-03-23 DIAGNOSIS — Z48811 Encounter for surgical aftercare following surgery on the nervous system: Secondary | ICD-10-CM | POA: Diagnosis not present

## 2021-03-23 DIAGNOSIS — M25529 Pain in unspecified elbow: Secondary | ICD-10-CM | POA: Diagnosis not present

## 2021-03-23 DIAGNOSIS — M25569 Pain in unspecified knee: Secondary | ICD-10-CM | POA: Diagnosis not present

## 2021-03-23 DIAGNOSIS — Z9682 Presence of neurostimulator: Secondary | ICD-10-CM | POA: Diagnosis not present

## 2021-03-23 DIAGNOSIS — F32A Depression, unspecified: Secondary | ICD-10-CM | POA: Diagnosis not present

## 2021-03-23 DIAGNOSIS — M1990 Unspecified osteoarthritis, unspecified site: Secondary | ICD-10-CM | POA: Diagnosis not present

## 2021-03-23 DIAGNOSIS — G20 Parkinson's disease: Secondary | ICD-10-CM | POA: Diagnosis not present

## 2021-03-23 DIAGNOSIS — S52502D Unspecified fracture of the lower end of left radius, subsequent encounter for closed fracture with routine healing: Secondary | ICD-10-CM | POA: Diagnosis not present

## 2021-03-24 DIAGNOSIS — G20 Parkinson's disease: Secondary | ICD-10-CM | POA: Diagnosis not present

## 2021-03-28 DIAGNOSIS — G20 Parkinson's disease: Secondary | ICD-10-CM | POA: Diagnosis not present

## 2021-03-28 DIAGNOSIS — M25529 Pain in unspecified elbow: Secondary | ICD-10-CM | POA: Diagnosis not present

## 2021-03-28 DIAGNOSIS — M25569 Pain in unspecified knee: Secondary | ICD-10-CM | POA: Diagnosis not present

## 2021-03-28 DIAGNOSIS — F32A Depression, unspecified: Secondary | ICD-10-CM | POA: Diagnosis not present

## 2021-03-28 DIAGNOSIS — G3184 Mild cognitive impairment, so stated: Secondary | ICD-10-CM | POA: Diagnosis not present

## 2021-03-28 DIAGNOSIS — Z48811 Encounter for surgical aftercare following surgery on the nervous system: Secondary | ICD-10-CM | POA: Diagnosis not present

## 2021-03-28 DIAGNOSIS — S52502D Unspecified fracture of the lower end of left radius, subsequent encounter for closed fracture with routine healing: Secondary | ICD-10-CM | POA: Diagnosis not present

## 2021-03-28 DIAGNOSIS — Z9682 Presence of neurostimulator: Secondary | ICD-10-CM | POA: Diagnosis not present

## 2021-03-28 DIAGNOSIS — S52501B Unspecified fracture of the lower end of right radius, initial encounter for open fracture type I or II: Secondary | ICD-10-CM | POA: Diagnosis not present

## 2021-03-28 DIAGNOSIS — M1990 Unspecified osteoarthritis, unspecified site: Secondary | ICD-10-CM | POA: Diagnosis not present

## 2021-03-29 DIAGNOSIS — Z48811 Encounter for surgical aftercare following surgery on the nervous system: Secondary | ICD-10-CM | POA: Diagnosis not present

## 2021-03-29 DIAGNOSIS — M1990 Unspecified osteoarthritis, unspecified site: Secondary | ICD-10-CM | POA: Diagnosis not present

## 2021-03-29 DIAGNOSIS — M25529 Pain in unspecified elbow: Secondary | ICD-10-CM | POA: Diagnosis not present

## 2021-03-29 DIAGNOSIS — F32A Depression, unspecified: Secondary | ICD-10-CM | POA: Diagnosis not present

## 2021-03-29 DIAGNOSIS — G3184 Mild cognitive impairment, so stated: Secondary | ICD-10-CM | POA: Diagnosis not present

## 2021-03-29 DIAGNOSIS — G20 Parkinson's disease: Secondary | ICD-10-CM | POA: Diagnosis not present

## 2021-03-29 DIAGNOSIS — Z9682 Presence of neurostimulator: Secondary | ICD-10-CM | POA: Diagnosis not present

## 2021-03-29 DIAGNOSIS — M25569 Pain in unspecified knee: Secondary | ICD-10-CM | POA: Diagnosis not present

## 2021-03-29 DIAGNOSIS — S52501B Unspecified fracture of the lower end of right radius, initial encounter for open fracture type I or II: Secondary | ICD-10-CM | POA: Diagnosis not present

## 2021-03-29 DIAGNOSIS — S52502D Unspecified fracture of the lower end of left radius, subsequent encounter for closed fracture with routine healing: Secondary | ICD-10-CM | POA: Diagnosis not present

## 2021-03-30 DIAGNOSIS — Z9682 Presence of neurostimulator: Secondary | ICD-10-CM | POA: Diagnosis not present

## 2021-03-30 DIAGNOSIS — S52502D Unspecified fracture of the lower end of left radius, subsequent encounter for closed fracture with routine healing: Secondary | ICD-10-CM | POA: Diagnosis not present

## 2021-03-30 DIAGNOSIS — S52501B Unspecified fracture of the lower end of right radius, initial encounter for open fracture type I or II: Secondary | ICD-10-CM | POA: Diagnosis not present

## 2021-03-30 DIAGNOSIS — M25569 Pain in unspecified knee: Secondary | ICD-10-CM | POA: Diagnosis not present

## 2021-03-30 DIAGNOSIS — G20 Parkinson's disease: Secondary | ICD-10-CM | POA: Diagnosis not present

## 2021-03-30 DIAGNOSIS — F32A Depression, unspecified: Secondary | ICD-10-CM | POA: Diagnosis not present

## 2021-03-30 DIAGNOSIS — G3184 Mild cognitive impairment, so stated: Secondary | ICD-10-CM | POA: Diagnosis not present

## 2021-03-30 DIAGNOSIS — M25529 Pain in unspecified elbow: Secondary | ICD-10-CM | POA: Diagnosis not present

## 2021-03-30 DIAGNOSIS — Z48811 Encounter for surgical aftercare following surgery on the nervous system: Secondary | ICD-10-CM | POA: Diagnosis not present

## 2021-03-30 DIAGNOSIS — M1990 Unspecified osteoarthritis, unspecified site: Secondary | ICD-10-CM | POA: Diagnosis not present

## 2021-04-04 DIAGNOSIS — S52501B Unspecified fracture of the lower end of right radius, initial encounter for open fracture type I or II: Secondary | ICD-10-CM | POA: Diagnosis not present

## 2021-04-04 DIAGNOSIS — G20 Parkinson's disease: Secondary | ICD-10-CM | POA: Diagnosis not present

## 2021-04-04 DIAGNOSIS — G3184 Mild cognitive impairment, so stated: Secondary | ICD-10-CM | POA: Diagnosis not present

## 2021-04-04 DIAGNOSIS — Z9682 Presence of neurostimulator: Secondary | ICD-10-CM | POA: Diagnosis not present

## 2021-04-04 DIAGNOSIS — Z48811 Encounter for surgical aftercare following surgery on the nervous system: Secondary | ICD-10-CM | POA: Diagnosis not present

## 2021-04-04 DIAGNOSIS — S52502D Unspecified fracture of the lower end of left radius, subsequent encounter for closed fracture with routine healing: Secondary | ICD-10-CM | POA: Diagnosis not present

## 2021-04-04 DIAGNOSIS — M25569 Pain in unspecified knee: Secondary | ICD-10-CM | POA: Diagnosis not present

## 2021-04-04 DIAGNOSIS — M25529 Pain in unspecified elbow: Secondary | ICD-10-CM | POA: Diagnosis not present

## 2021-04-04 DIAGNOSIS — M1990 Unspecified osteoarthritis, unspecified site: Secondary | ICD-10-CM | POA: Diagnosis not present

## 2021-04-04 DIAGNOSIS — F32A Depression, unspecified: Secondary | ICD-10-CM | POA: Diagnosis not present

## 2021-04-05 DIAGNOSIS — M25569 Pain in unspecified knee: Secondary | ICD-10-CM | POA: Diagnosis not present

## 2021-04-05 DIAGNOSIS — Z48811 Encounter for surgical aftercare following surgery on the nervous system: Secondary | ICD-10-CM | POA: Diagnosis not present

## 2021-04-05 DIAGNOSIS — G20 Parkinson's disease: Secondary | ICD-10-CM | POA: Diagnosis not present

## 2021-04-05 DIAGNOSIS — Z9682 Presence of neurostimulator: Secondary | ICD-10-CM | POA: Diagnosis not present

## 2021-04-05 DIAGNOSIS — S52501B Unspecified fracture of the lower end of right radius, initial encounter for open fracture type I or II: Secondary | ICD-10-CM | POA: Diagnosis not present

## 2021-04-05 DIAGNOSIS — G3184 Mild cognitive impairment, so stated: Secondary | ICD-10-CM | POA: Diagnosis not present

## 2021-04-05 DIAGNOSIS — S52502D Unspecified fracture of the lower end of left radius, subsequent encounter for closed fracture with routine healing: Secondary | ICD-10-CM | POA: Diagnosis not present

## 2021-04-05 DIAGNOSIS — M25529 Pain in unspecified elbow: Secondary | ICD-10-CM | POA: Diagnosis not present

## 2021-04-05 DIAGNOSIS — F32A Depression, unspecified: Secondary | ICD-10-CM | POA: Diagnosis not present

## 2021-04-05 DIAGNOSIS — M1990 Unspecified osteoarthritis, unspecified site: Secondary | ICD-10-CM | POA: Diagnosis not present

## 2021-04-06 DIAGNOSIS — Z48811 Encounter for surgical aftercare following surgery on the nervous system: Secondary | ICD-10-CM | POA: Diagnosis not present

## 2021-04-06 DIAGNOSIS — S52501B Unspecified fracture of the lower end of right radius, initial encounter for open fracture type I or II: Secondary | ICD-10-CM | POA: Diagnosis not present

## 2021-04-06 DIAGNOSIS — G20 Parkinson's disease: Secondary | ICD-10-CM | POA: Diagnosis not present

## 2021-04-06 DIAGNOSIS — M25569 Pain in unspecified knee: Secondary | ICD-10-CM | POA: Diagnosis not present

## 2021-04-06 DIAGNOSIS — M1990 Unspecified osteoarthritis, unspecified site: Secondary | ICD-10-CM | POA: Diagnosis not present

## 2021-04-06 DIAGNOSIS — M25529 Pain in unspecified elbow: Secondary | ICD-10-CM | POA: Diagnosis not present

## 2021-04-06 DIAGNOSIS — Z9682 Presence of neurostimulator: Secondary | ICD-10-CM | POA: Diagnosis not present

## 2021-04-06 DIAGNOSIS — S52502D Unspecified fracture of the lower end of left radius, subsequent encounter for closed fracture with routine healing: Secondary | ICD-10-CM | POA: Diagnosis not present

## 2021-04-06 DIAGNOSIS — G3184 Mild cognitive impairment, so stated: Secondary | ICD-10-CM | POA: Diagnosis not present

## 2021-04-06 DIAGNOSIS — F32A Depression, unspecified: Secondary | ICD-10-CM | POA: Diagnosis not present

## 2021-04-07 DIAGNOSIS — S52502D Unspecified fracture of the lower end of left radius, subsequent encounter for closed fracture with routine healing: Secondary | ICD-10-CM | POA: Diagnosis not present

## 2021-04-07 DIAGNOSIS — Z982 Presence of cerebrospinal fluid drainage device: Secondary | ICD-10-CM | POA: Diagnosis not present

## 2021-04-07 DIAGNOSIS — Z9682 Presence of neurostimulator: Secondary | ICD-10-CM | POA: Diagnosis not present

## 2021-04-07 DIAGNOSIS — S62102A Fracture of unspecified carpal bone, left wrist, initial encounter for closed fracture: Secondary | ICD-10-CM | POA: Diagnosis not present

## 2021-04-07 DIAGNOSIS — Z4802 Encounter for removal of sutures: Secondary | ICD-10-CM | POA: Diagnosis not present

## 2021-04-07 DIAGNOSIS — S62102D Fracture of unspecified carpal bone, left wrist, subsequent encounter for fracture with routine healing: Secondary | ICD-10-CM | POA: Diagnosis not present

## 2021-04-07 DIAGNOSIS — G20 Parkinson's disease: Secondary | ICD-10-CM | POA: Diagnosis not present

## 2021-04-11 DIAGNOSIS — G3184 Mild cognitive impairment, so stated: Secondary | ICD-10-CM | POA: Diagnosis not present

## 2021-04-11 DIAGNOSIS — F32A Depression, unspecified: Secondary | ICD-10-CM | POA: Diagnosis not present

## 2021-04-11 DIAGNOSIS — M25569 Pain in unspecified knee: Secondary | ICD-10-CM | POA: Diagnosis not present

## 2021-04-11 DIAGNOSIS — Z9682 Presence of neurostimulator: Secondary | ICD-10-CM | POA: Diagnosis not present

## 2021-04-11 DIAGNOSIS — G20 Parkinson's disease: Secondary | ICD-10-CM | POA: Diagnosis not present

## 2021-04-11 DIAGNOSIS — Z48811 Encounter for surgical aftercare following surgery on the nervous system: Secondary | ICD-10-CM | POA: Diagnosis not present

## 2021-04-11 DIAGNOSIS — M25529 Pain in unspecified elbow: Secondary | ICD-10-CM | POA: Diagnosis not present

## 2021-04-11 DIAGNOSIS — S52501B Unspecified fracture of the lower end of right radius, initial encounter for open fracture type I or II: Secondary | ICD-10-CM | POA: Diagnosis not present

## 2021-04-11 DIAGNOSIS — S52502D Unspecified fracture of the lower end of left radius, subsequent encounter for closed fracture with routine healing: Secondary | ICD-10-CM | POA: Diagnosis not present

## 2021-04-11 DIAGNOSIS — M1990 Unspecified osteoarthritis, unspecified site: Secondary | ICD-10-CM | POA: Diagnosis not present

## 2021-04-12 DIAGNOSIS — M25529 Pain in unspecified elbow: Secondary | ICD-10-CM | POA: Diagnosis not present

## 2021-04-12 DIAGNOSIS — G20 Parkinson's disease: Secondary | ICD-10-CM | POA: Diagnosis not present

## 2021-04-12 DIAGNOSIS — F32A Depression, unspecified: Secondary | ICD-10-CM | POA: Diagnosis not present

## 2021-04-12 DIAGNOSIS — M25569 Pain in unspecified knee: Secondary | ICD-10-CM | POA: Diagnosis not present

## 2021-04-12 DIAGNOSIS — M1990 Unspecified osteoarthritis, unspecified site: Secondary | ICD-10-CM | POA: Diagnosis not present

## 2021-04-12 DIAGNOSIS — S52502D Unspecified fracture of the lower end of left radius, subsequent encounter for closed fracture with routine healing: Secondary | ICD-10-CM | POA: Diagnosis not present

## 2021-04-12 DIAGNOSIS — S52501B Unspecified fracture of the lower end of right radius, initial encounter for open fracture type I or II: Secondary | ICD-10-CM | POA: Diagnosis not present

## 2021-04-12 DIAGNOSIS — G3184 Mild cognitive impairment, so stated: Secondary | ICD-10-CM | POA: Diagnosis not present

## 2021-04-12 DIAGNOSIS — Z9682 Presence of neurostimulator: Secondary | ICD-10-CM | POA: Diagnosis not present

## 2021-04-12 DIAGNOSIS — Z48811 Encounter for surgical aftercare following surgery on the nervous system: Secondary | ICD-10-CM | POA: Diagnosis not present

## 2021-04-13 DIAGNOSIS — Z9682 Presence of neurostimulator: Secondary | ICD-10-CM | POA: Diagnosis not present

## 2021-04-13 DIAGNOSIS — G20 Parkinson's disease: Secondary | ICD-10-CM | POA: Diagnosis not present

## 2021-04-27 DIAGNOSIS — Z9682 Presence of neurostimulator: Secondary | ICD-10-CM | POA: Diagnosis not present

## 2021-04-27 DIAGNOSIS — Z79899 Other long term (current) drug therapy: Secondary | ICD-10-CM | POA: Diagnosis not present

## 2021-04-27 DIAGNOSIS — Z9689 Presence of other specified functional implants: Secondary | ICD-10-CM | POA: Diagnosis not present

## 2021-04-27 DIAGNOSIS — Z888 Allergy status to other drugs, medicaments and biological substances status: Secondary | ICD-10-CM | POA: Diagnosis not present

## 2021-04-27 DIAGNOSIS — G20 Parkinson's disease: Secondary | ICD-10-CM | POA: Diagnosis not present

## 2021-04-27 DIAGNOSIS — S52502S Unspecified fracture of the lower end of left radius, sequela: Secondary | ICD-10-CM | POA: Diagnosis not present

## 2021-04-27 DIAGNOSIS — S52502D Unspecified fracture of the lower end of left radius, subsequent encounter for closed fracture with routine healing: Secondary | ICD-10-CM | POA: Diagnosis not present

## 2021-07-22 DIAGNOSIS — Z79899 Other long term (current) drug therapy: Secondary | ICD-10-CM | POA: Diagnosis not present

## 2021-07-22 DIAGNOSIS — Z9682 Presence of neurostimulator: Secondary | ICD-10-CM | POA: Diagnosis not present

## 2021-07-22 DIAGNOSIS — G3184 Mild cognitive impairment, so stated: Secondary | ICD-10-CM | POA: Diagnosis not present

## 2021-07-22 DIAGNOSIS — G20 Parkinson's disease: Secondary | ICD-10-CM | POA: Diagnosis not present

## 2021-09-22 DIAGNOSIS — Z23 Encounter for immunization: Secondary | ICD-10-CM | POA: Diagnosis not present

## 2021-09-22 DIAGNOSIS — Z0000 Encounter for general adult medical examination without abnormal findings: Secondary | ICD-10-CM | POA: Diagnosis not present

## 2021-09-27 DIAGNOSIS — Z9682 Presence of neurostimulator: Secondary | ICD-10-CM | POA: Diagnosis not present

## 2021-09-27 DIAGNOSIS — Z9689 Presence of other specified functional implants: Secondary | ICD-10-CM | POA: Diagnosis not present

## 2021-09-27 DIAGNOSIS — G20 Parkinson's disease: Secondary | ICD-10-CM | POA: Diagnosis not present

## 2021-10-11 DIAGNOSIS — Z961 Presence of intraocular lens: Secondary | ICD-10-CM | POA: Diagnosis not present

## 2021-10-11 DIAGNOSIS — H401132 Primary open-angle glaucoma, bilateral, moderate stage: Secondary | ICD-10-CM | POA: Diagnosis not present

## 2021-10-11 DIAGNOSIS — H0102B Squamous blepharitis left eye, upper and lower eyelids: Secondary | ICD-10-CM | POA: Diagnosis not present

## 2021-10-11 DIAGNOSIS — H0102A Squamous blepharitis right eye, upper and lower eyelids: Secondary | ICD-10-CM | POA: Diagnosis not present

## 2021-10-21 DIAGNOSIS — I10 Essential (primary) hypertension: Secondary | ICD-10-CM | POA: Diagnosis not present

## 2021-10-21 DIAGNOSIS — M810 Age-related osteoporosis without current pathological fracture: Secondary | ICD-10-CM | POA: Diagnosis not present

## 2021-11-01 DIAGNOSIS — Z1231 Encounter for screening mammogram for malignant neoplasm of breast: Secondary | ICD-10-CM | POA: Diagnosis not present

## 2021-11-15 DIAGNOSIS — R92332 Mammographic heterogeneous density, left breast: Secondary | ICD-10-CM | POA: Diagnosis not present

## 2021-11-15 DIAGNOSIS — R922 Inconclusive mammogram: Secondary | ICD-10-CM | POA: Diagnosis not present

## 2021-11-15 DIAGNOSIS — N6042 Mammary duct ectasia of left breast: Secondary | ICD-10-CM | POA: Diagnosis not present

## 2022-01-25 DIAGNOSIS — G20A1 Parkinson's disease without dyskinesia, without mention of fluctuations: Secondary | ICD-10-CM | POA: Diagnosis not present

## 2022-01-25 DIAGNOSIS — Z9989 Dependence on other enabling machines and devices: Secondary | ICD-10-CM | POA: Diagnosis not present

## 2022-01-25 DIAGNOSIS — Z23 Encounter for immunization: Secondary | ICD-10-CM | POA: Diagnosis not present

## 2022-01-25 DIAGNOSIS — I10 Essential (primary) hypertension: Secondary | ICD-10-CM | POA: Diagnosis not present

## 2022-01-25 DIAGNOSIS — M810 Age-related osteoporosis without current pathological fracture: Secondary | ICD-10-CM | POA: Diagnosis not present

## 2022-02-02 DIAGNOSIS — Z79899 Other long term (current) drug therapy: Secondary | ICD-10-CM | POA: Diagnosis not present

## 2022-02-02 DIAGNOSIS — G20B1 Parkinson's disease with dyskinesia, without mention of fluctuations: Secondary | ICD-10-CM | POA: Diagnosis not present

## 2022-02-02 DIAGNOSIS — Z9682 Presence of neurostimulator: Secondary | ICD-10-CM | POA: Diagnosis not present

## 2022-02-02 DIAGNOSIS — G20A1 Parkinson's disease without dyskinesia, without mention of fluctuations: Secondary | ICD-10-CM | POA: Diagnosis not present

## 2022-02-14 DIAGNOSIS — Z961 Presence of intraocular lens: Secondary | ICD-10-CM | POA: Diagnosis not present

## 2022-02-14 DIAGNOSIS — H0102B Squamous blepharitis left eye, upper and lower eyelids: Secondary | ICD-10-CM | POA: Diagnosis not present

## 2022-02-14 DIAGNOSIS — H401132 Primary open-angle glaucoma, bilateral, moderate stage: Secondary | ICD-10-CM | POA: Diagnosis not present

## 2022-02-14 DIAGNOSIS — H0102A Squamous blepharitis right eye, upper and lower eyelids: Secondary | ICD-10-CM | POA: Diagnosis not present

## 2022-05-28 IMAGING — CR DG SHOULDER 2+V*R*
3 series · 3 of 3 positions shown · non-contrast
Comparison: None.

CLINICAL DATA: Right shoulder pain

EXAM:
RIGHT SHOULDER - 2+ VIEW

[w shoulder ap internal righ]
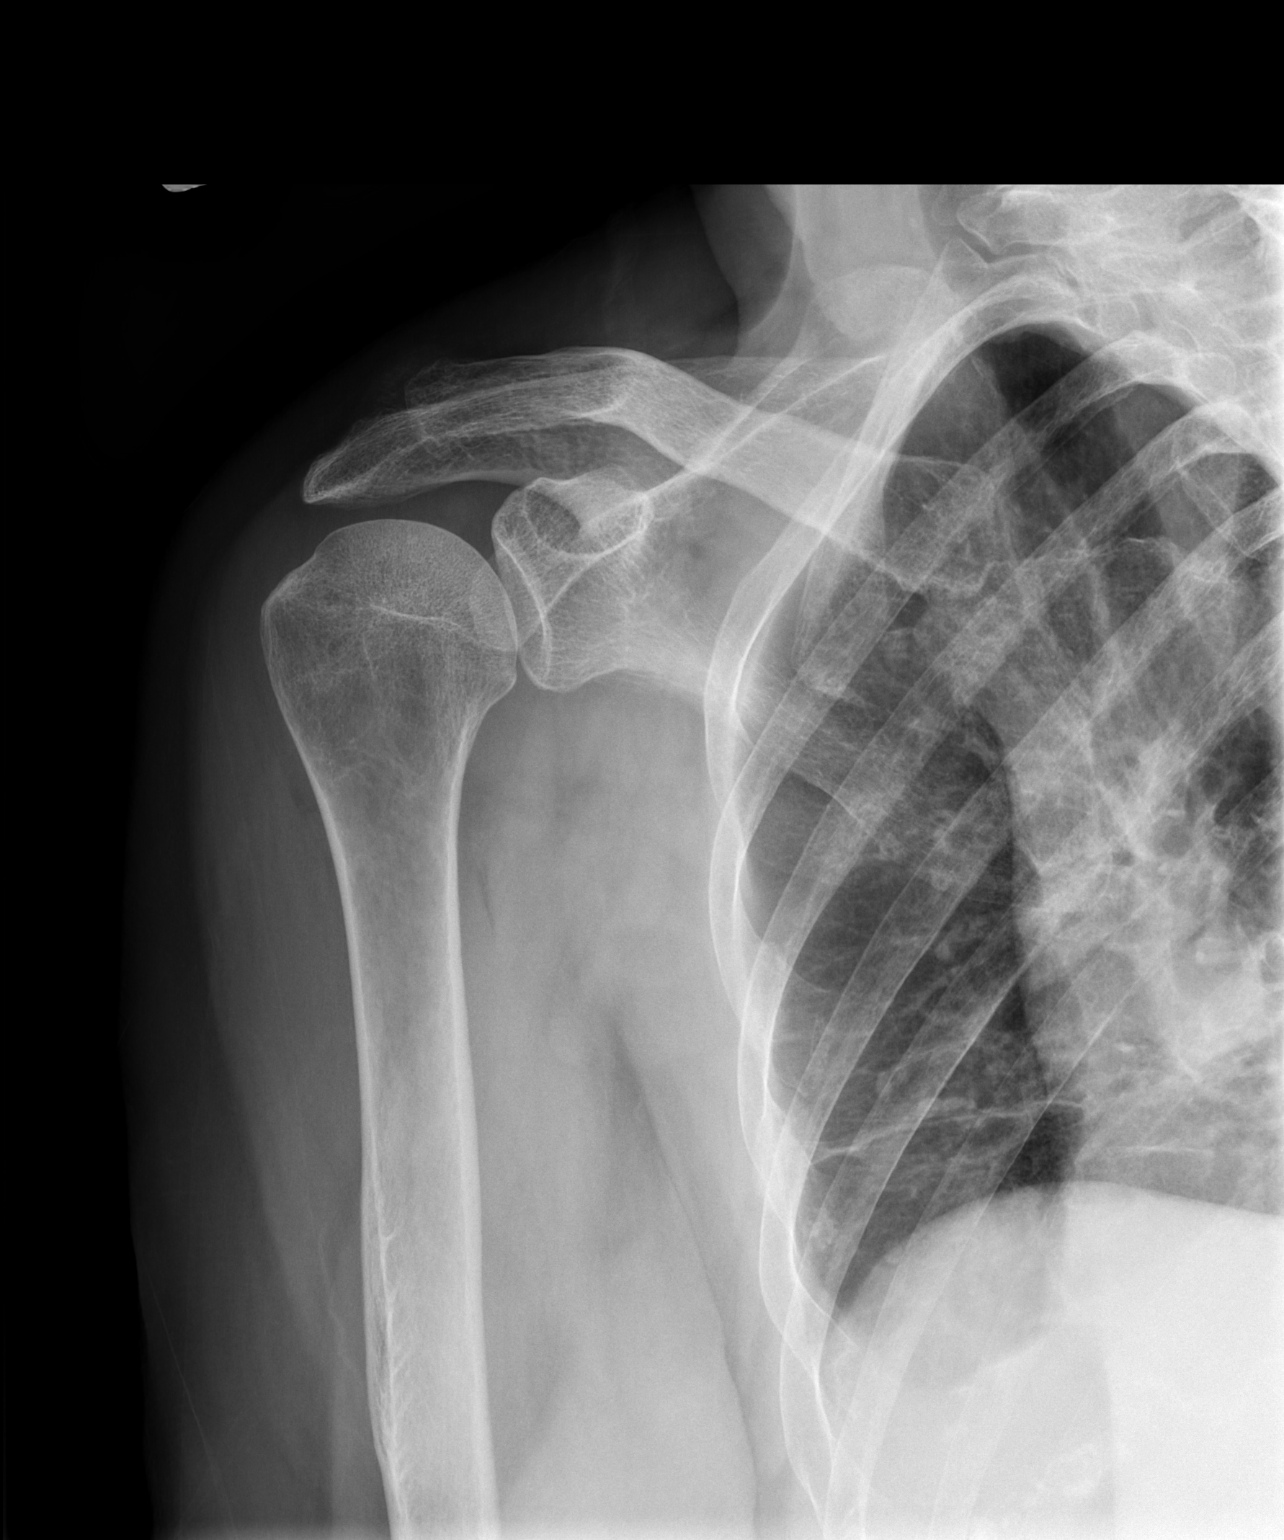

[w shoulder y view right]
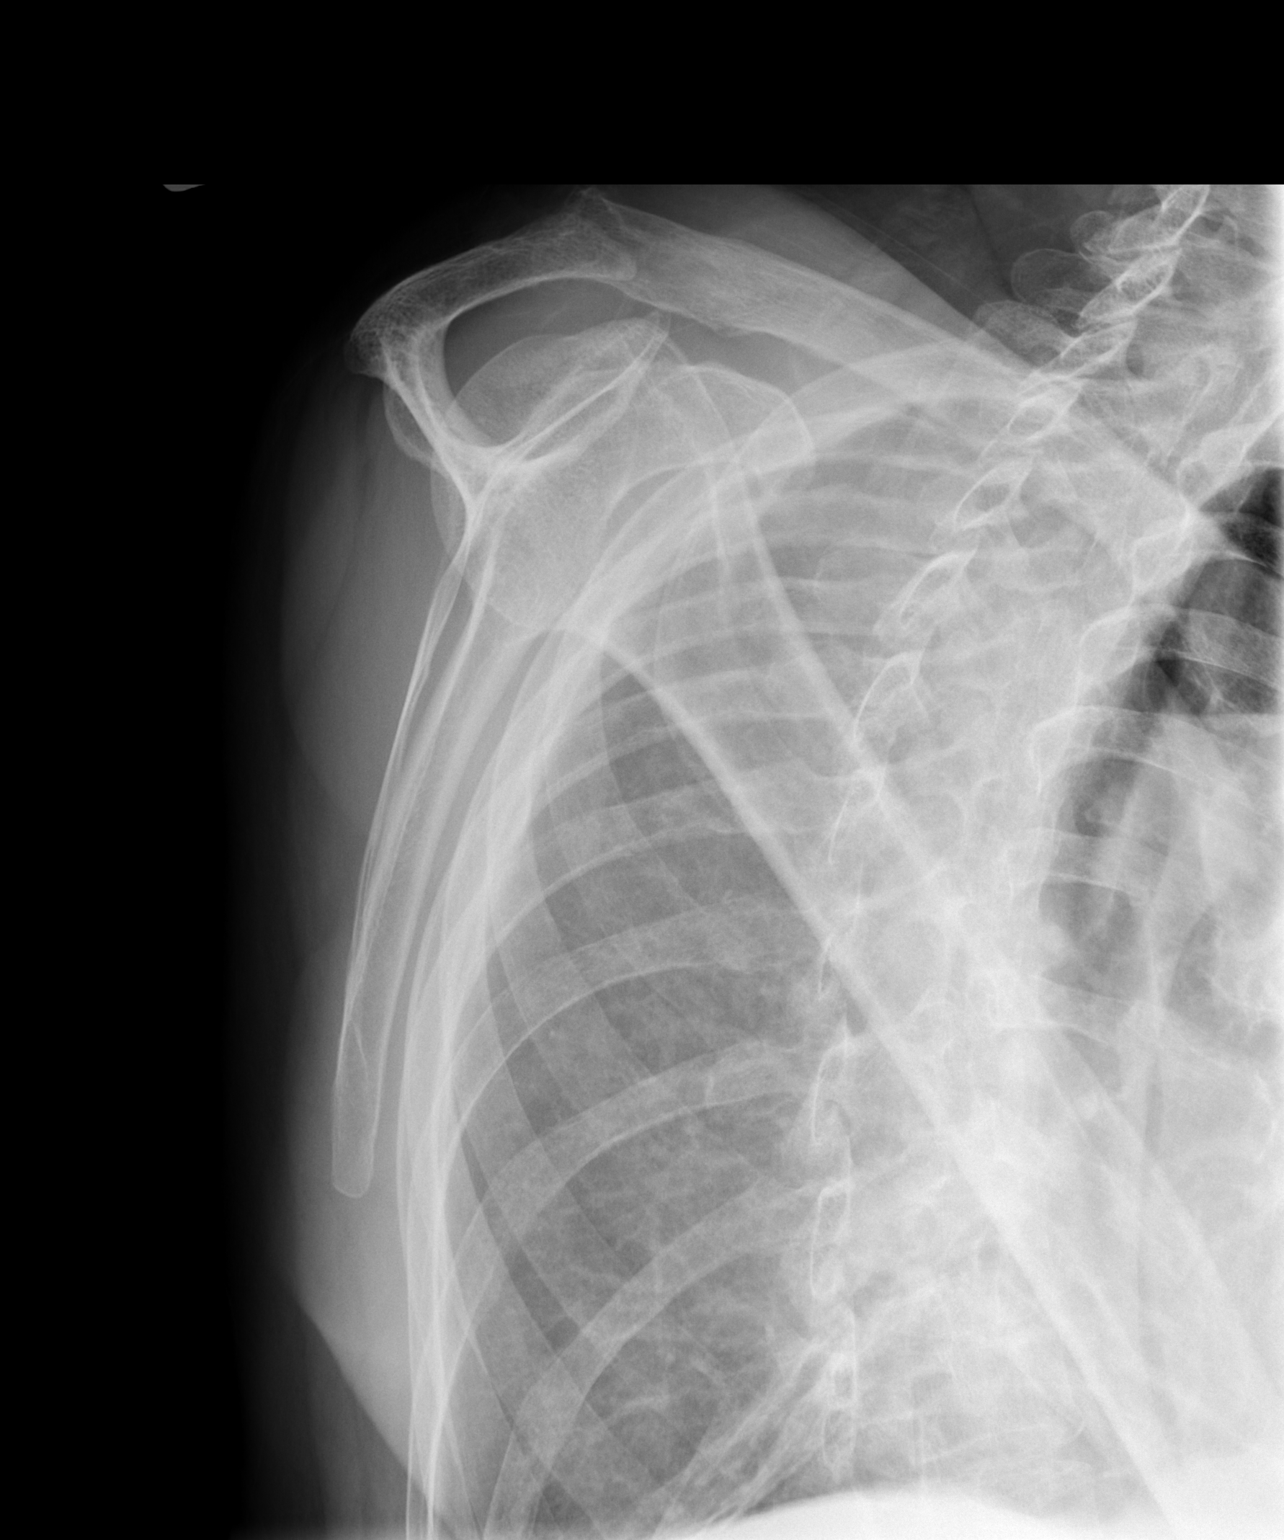

[w shoulder axillary right *]
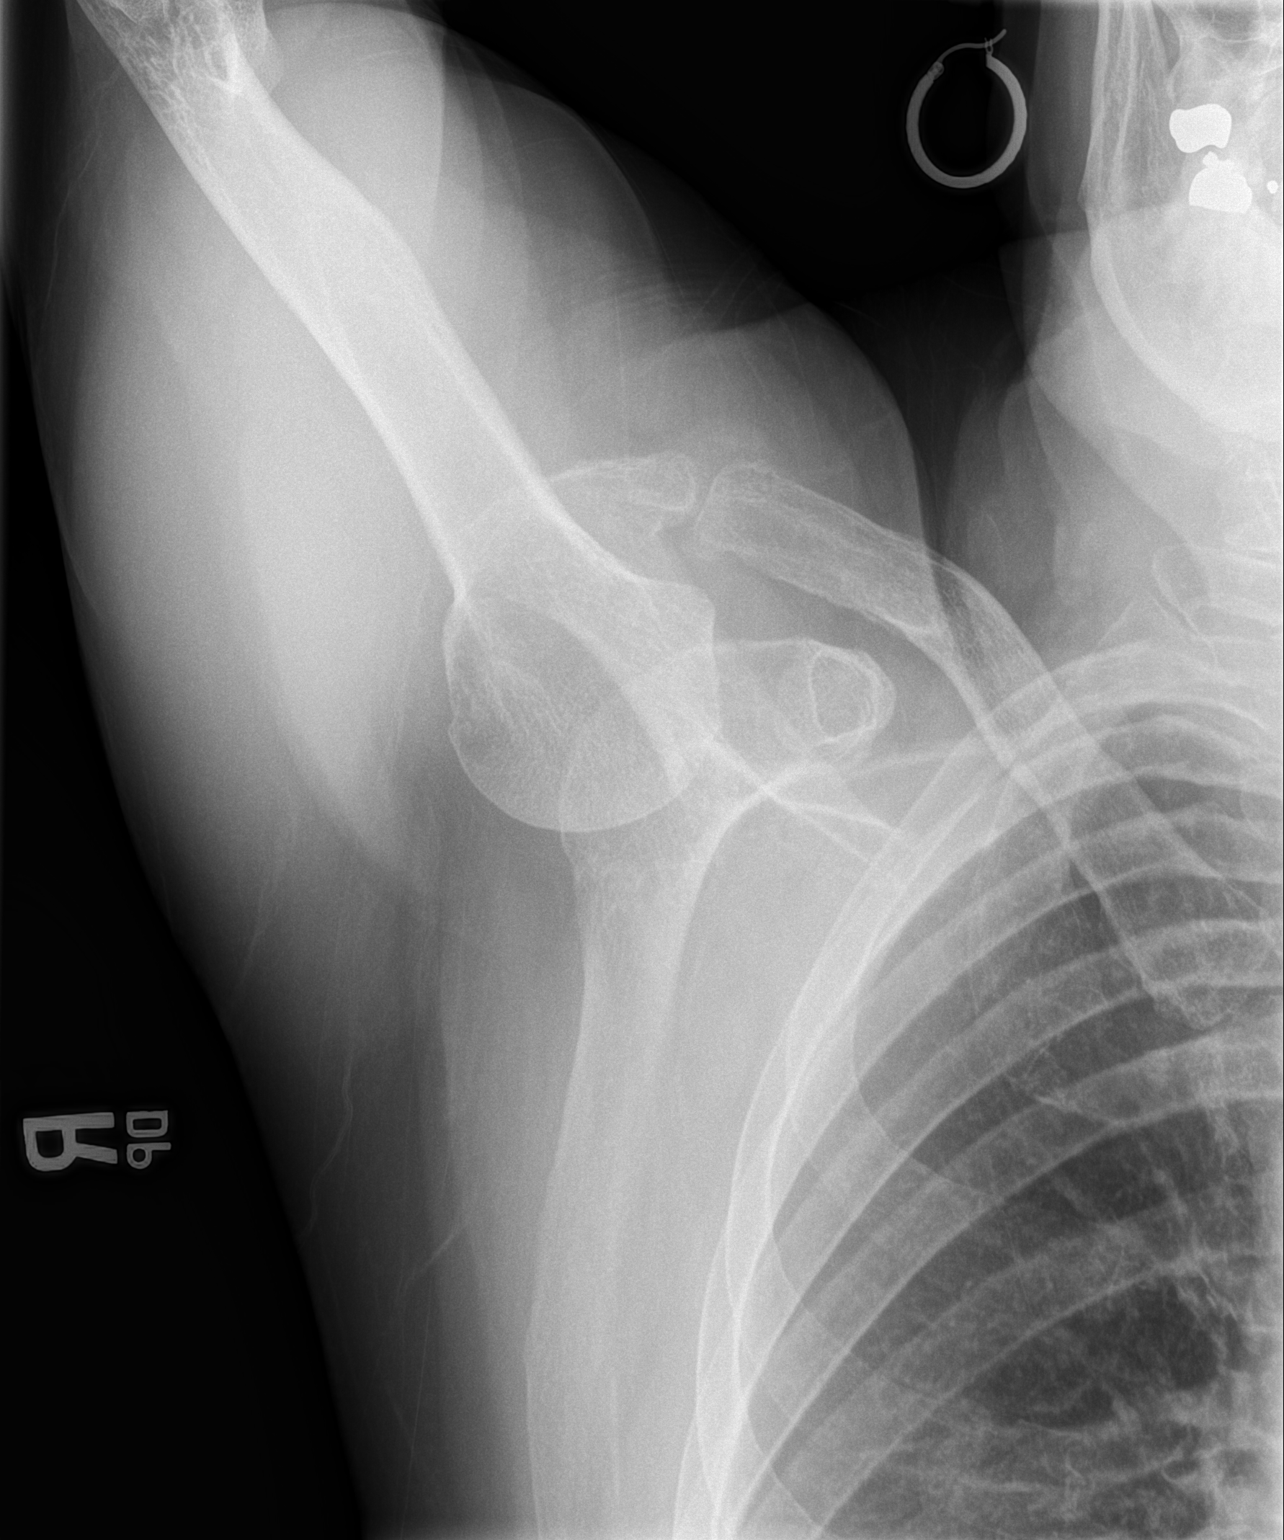

[3 of 3 positions shown; findings below may reference images not displayed]

FINDINGS: There is no evidence of fracture or dislocation. There is no
evidence of arthropathy or other focal bone abnormality. Soft
tissues are unremarkable.
IMPRESSION: Negative.

## 2022-05-28 IMAGING — CR DG KNEE 1-2V*L*
2 series · 2 of 2 positions shown · non-contrast
Comparison: 05/11/2009

CLINICAL DATA: LEFT KNEE PAIN

EXAM:
LEFT KNEE - 1-2 VIEW

[t knee ap left]
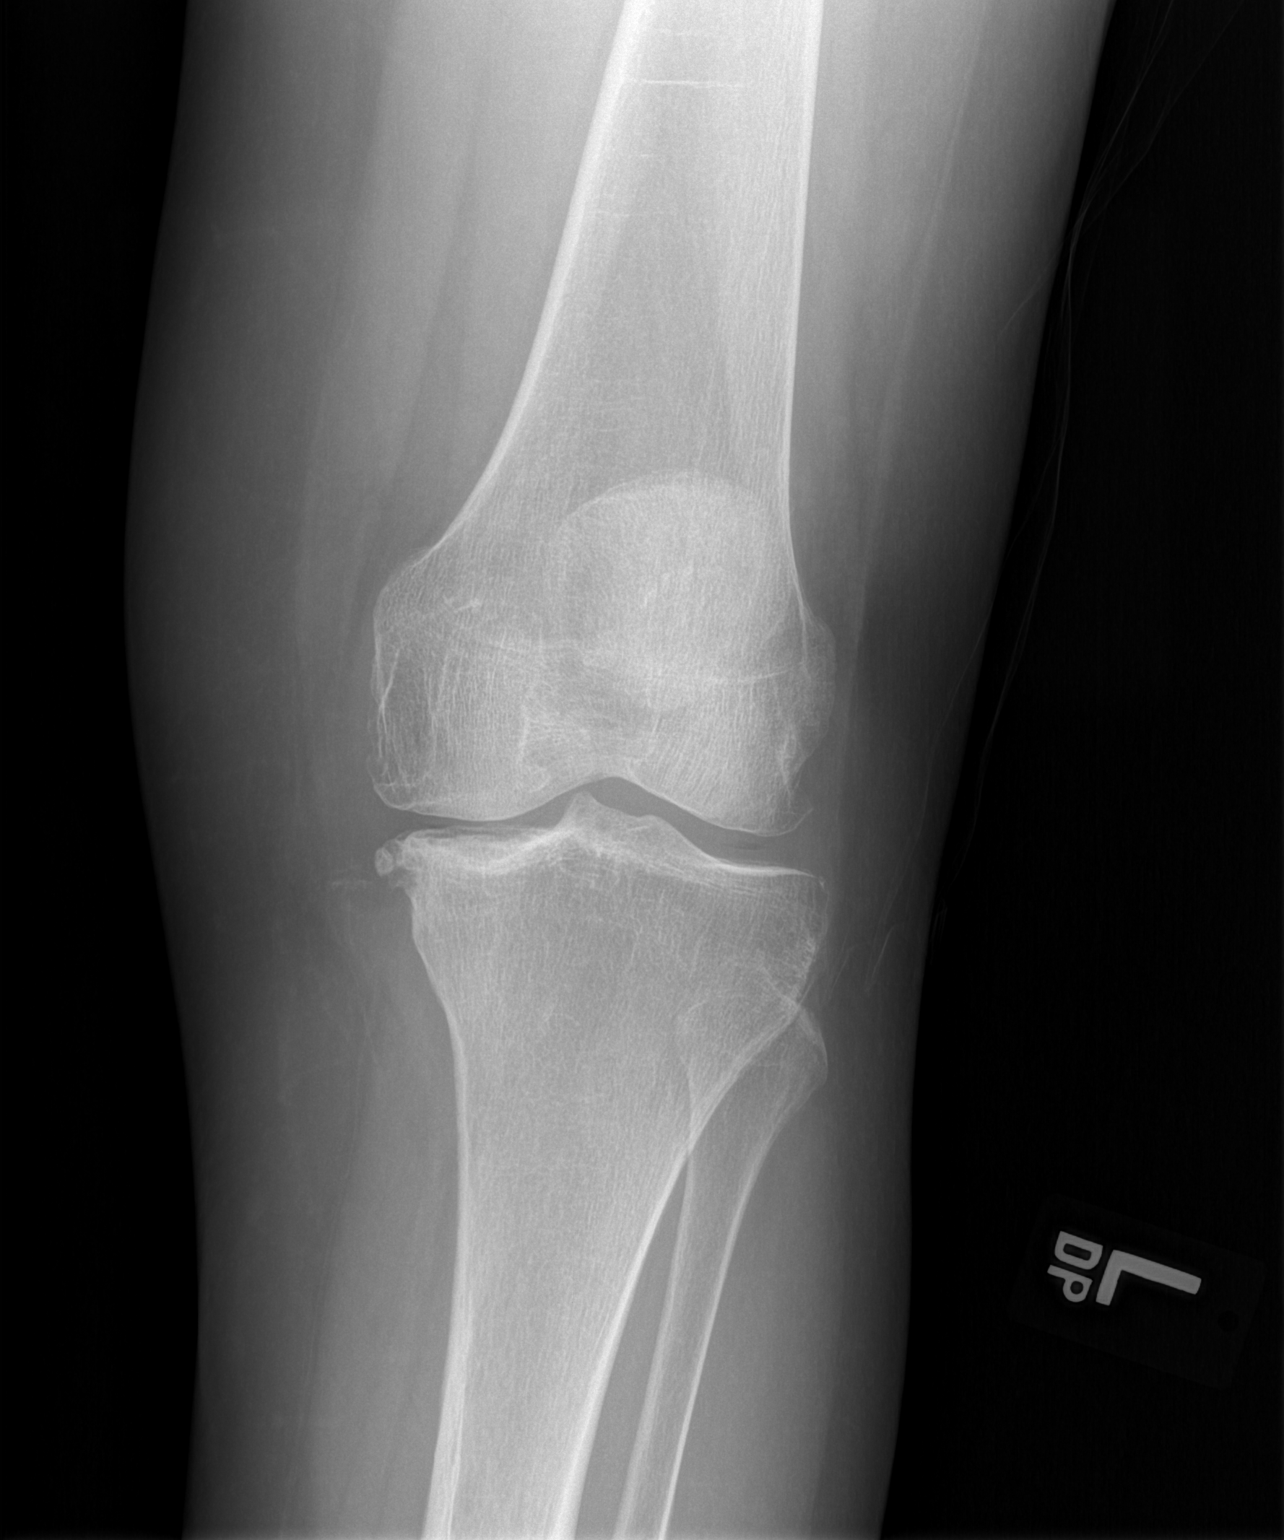

[t knee lat left]
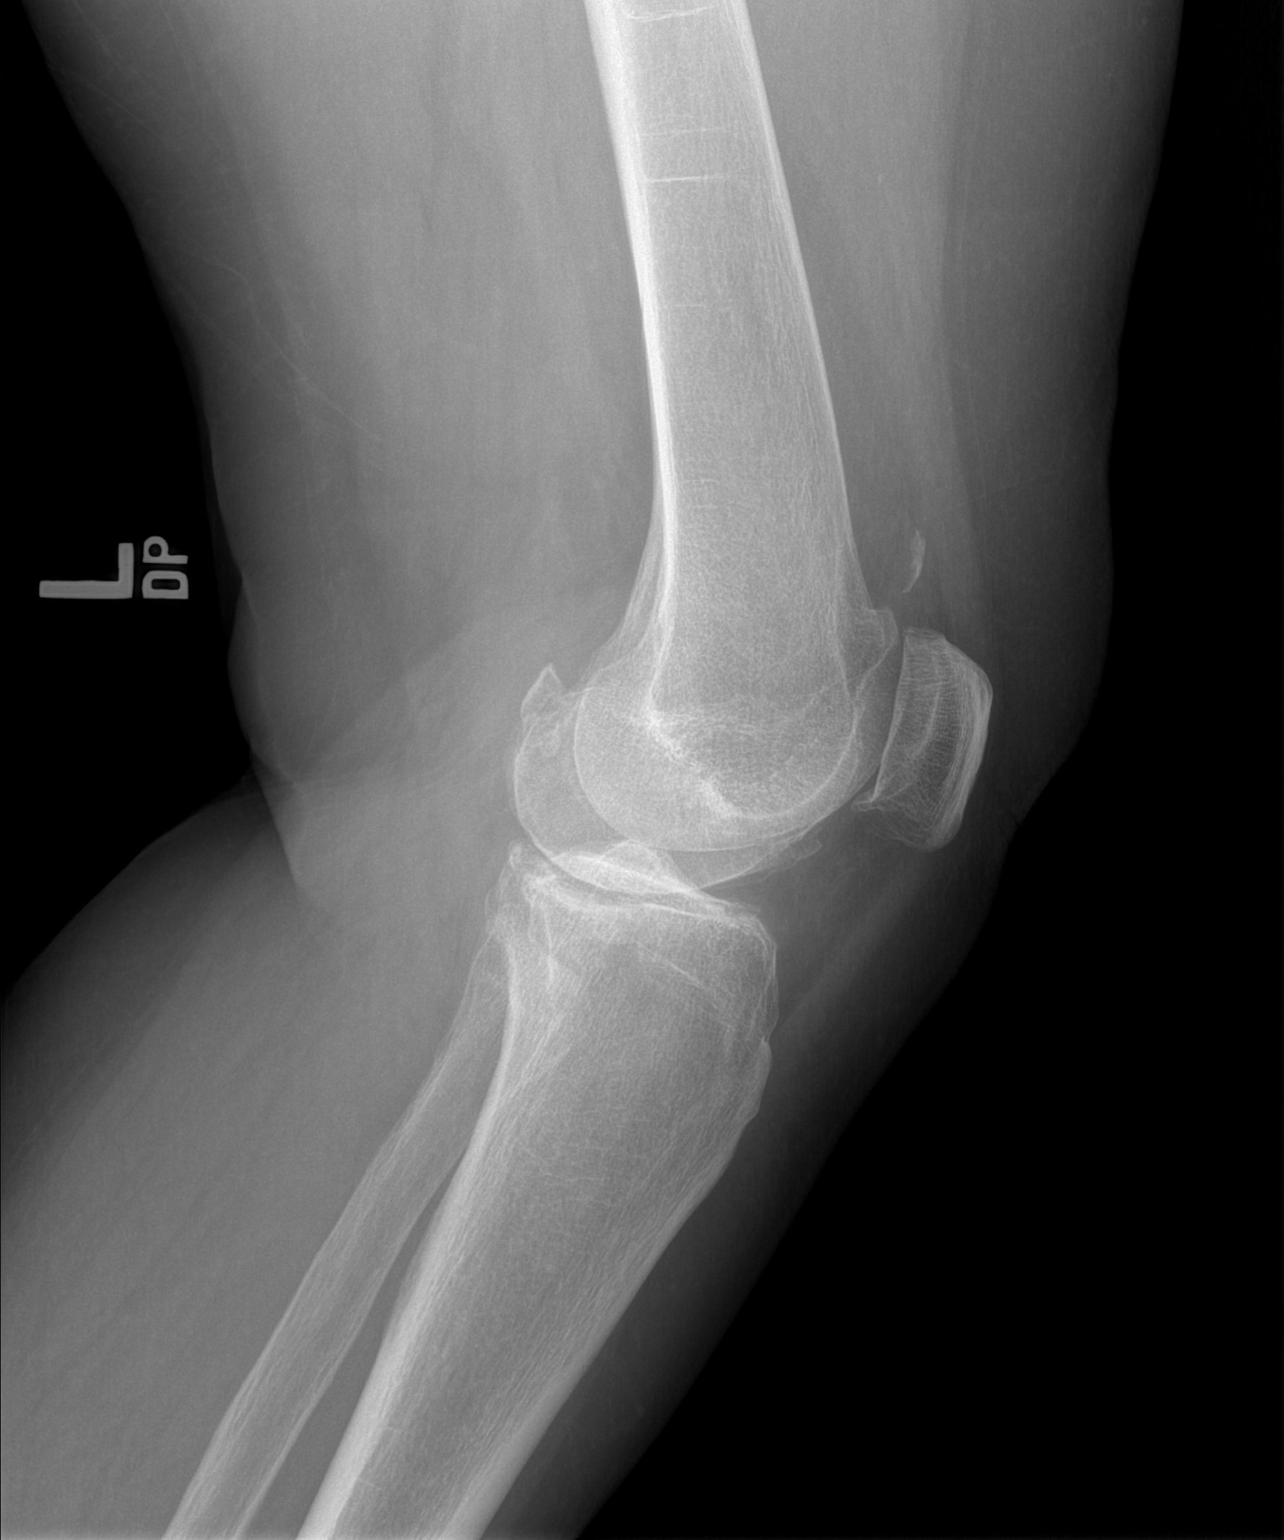

[2 of 2 positions shown; findings below may reference images not displayed]

FINDINGS: Normal alignment. No acute fracture or dislocation. Progressive,
mild to moderate tricompartmental degenerative arthritis, most
severe within the medial compartment. A 13 mm corticated ossific
density is seen within the suprapatellar region on lateral
examination which may represent an intra-articular loose body within
the suprapatellar recess, a calcified suprapatellar bursa, or the
sequela of remote trauma. No effusion. Soft tissues are otherwise
unremarkable.
IMPRESSION: Mild to moderate tricompartmental degenerative arthritis, most
severe within the medial compartment.

13 mm corticated density within the supra teller region on lateral
examination. Differential considerations as listed above.

## 2022-10-04 DIAGNOSIS — Z9989 Dependence on other enabling machines and devices: Secondary | ICD-10-CM | POA: Diagnosis not present

## 2022-10-04 DIAGNOSIS — I10 Essential (primary) hypertension: Secondary | ICD-10-CM | POA: Diagnosis not present

## 2022-10-04 DIAGNOSIS — Z23 Encounter for immunization: Secondary | ICD-10-CM | POA: Diagnosis not present

## 2022-10-04 DIAGNOSIS — M810 Age-related osteoporosis without current pathological fracture: Secondary | ICD-10-CM | POA: Diagnosis not present

## 2022-10-04 DIAGNOSIS — Z0000 Encounter for general adult medical examination without abnormal findings: Secondary | ICD-10-CM | POA: Diagnosis not present

## 2022-11-14 DIAGNOSIS — Z9689 Presence of other specified functional implants: Secondary | ICD-10-CM | POA: Diagnosis not present

## 2022-11-14 DIAGNOSIS — G20A1 Parkinson's disease without dyskinesia, without mention of fluctuations: Secondary | ICD-10-CM | POA: Diagnosis not present

## 2022-11-14 DIAGNOSIS — Z79899 Other long term (current) drug therapy: Secondary | ICD-10-CM | POA: Diagnosis not present

## 2022-11-14 DIAGNOSIS — Z9682 Presence of neurostimulator: Secondary | ICD-10-CM | POA: Diagnosis not present

## 2023-04-10 DIAGNOSIS — G20A1 Parkinson's disease without dyskinesia, without mention of fluctuations: Secondary | ICD-10-CM | POA: Diagnosis not present

## 2023-04-10 DIAGNOSIS — I10 Essential (primary) hypertension: Secondary | ICD-10-CM | POA: Diagnosis not present

## 2023-04-10 DIAGNOSIS — F419 Anxiety disorder, unspecified: Secondary | ICD-10-CM | POA: Diagnosis not present

## 2023-04-10 DIAGNOSIS — Z6830 Body mass index (BMI) 30.0-30.9, adult: Secondary | ICD-10-CM | POA: Diagnosis not present

## 2023-04-10 DIAGNOSIS — F325 Major depressive disorder, single episode, in full remission: Secondary | ICD-10-CM | POA: Diagnosis not present

## 2023-04-10 DIAGNOSIS — E7800 Pure hypercholesterolemia, unspecified: Secondary | ICD-10-CM | POA: Diagnosis not present

## 2023-04-10 DIAGNOSIS — M810 Age-related osteoporosis without current pathological fracture: Secondary | ICD-10-CM | POA: Diagnosis not present

## 2023-04-10 DIAGNOSIS — F0280 Dementia in other diseases classified elsewhere without behavioral disturbance: Secondary | ICD-10-CM | POA: Diagnosis not present

## 2023-05-01 DIAGNOSIS — I10 Essential (primary) hypertension: Secondary | ICD-10-CM | POA: Diagnosis not present

## 2023-05-01 DIAGNOSIS — Z6830 Body mass index (BMI) 30.0-30.9, adult: Secondary | ICD-10-CM | POA: Diagnosis not present

## 2023-05-14 DIAGNOSIS — F02A2 Dementia in other diseases classified elsewhere, mild, with psychotic disturbance: Secondary | ICD-10-CM | POA: Diagnosis not present

## 2023-05-14 DIAGNOSIS — Z9682 Presence of neurostimulator: Secondary | ICD-10-CM | POA: Diagnosis not present

## 2023-05-14 DIAGNOSIS — M549 Dorsalgia, unspecified: Secondary | ICD-10-CM | POA: Diagnosis not present

## 2023-05-14 DIAGNOSIS — R413 Other amnesia: Secondary | ICD-10-CM | POA: Diagnosis not present

## 2023-05-14 DIAGNOSIS — G20A1 Parkinson's disease without dyskinesia, without mention of fluctuations: Secondary | ICD-10-CM | POA: Diagnosis not present

## 2023-05-14 DIAGNOSIS — Z79899 Other long term (current) drug therapy: Secondary | ICD-10-CM | POA: Diagnosis not present

## 2023-05-14 DIAGNOSIS — R32 Unspecified urinary incontinence: Secondary | ICD-10-CM | POA: Diagnosis not present

## 2023-05-14 DIAGNOSIS — N3946 Mixed incontinence: Secondary | ICD-10-CM | POA: Diagnosis not present

## 2023-07-27 DIAGNOSIS — E7800 Pure hypercholesterolemia, unspecified: Secondary | ICD-10-CM | POA: Diagnosis not present

## 2023-07-27 DIAGNOSIS — I10 Essential (primary) hypertension: Secondary | ICD-10-CM | POA: Diagnosis not present

## 2023-10-16 DIAGNOSIS — Z0000 Encounter for general adult medical examination without abnormal findings: Secondary | ICD-10-CM | POA: Diagnosis not present

## 2023-10-16 DIAGNOSIS — Z23 Encounter for immunization: Secondary | ICD-10-CM | POA: Diagnosis not present

## 2023-10-24 DIAGNOSIS — G20A1 Parkinson's disease without dyskinesia, without mention of fluctuations: Secondary | ICD-10-CM | POA: Diagnosis not present

## 2023-10-24 DIAGNOSIS — F325 Major depressive disorder, single episode, in full remission: Secondary | ICD-10-CM | POA: Diagnosis not present

## 2023-10-24 DIAGNOSIS — E7800 Pure hypercholesterolemia, unspecified: Secondary | ICD-10-CM | POA: Diagnosis not present

## 2023-10-24 DIAGNOSIS — F0280 Dementia in other diseases classified elsewhere without behavioral disturbance: Secondary | ICD-10-CM | POA: Diagnosis not present

## 2023-10-24 DIAGNOSIS — I10 Essential (primary) hypertension: Secondary | ICD-10-CM | POA: Diagnosis not present

## 2023-10-24 DIAGNOSIS — M545 Low back pain, unspecified: Secondary | ICD-10-CM | POA: Diagnosis not present

## 2023-10-24 DIAGNOSIS — Z6830 Body mass index (BMI) 30.0-30.9, adult: Secondary | ICD-10-CM | POA: Diagnosis not present

## 2023-10-24 DIAGNOSIS — M810 Age-related osteoporosis without current pathological fracture: Secondary | ICD-10-CM | POA: Diagnosis not present

## 2023-10-24 DIAGNOSIS — R1032 Left lower quadrant pain: Secondary | ICD-10-CM | POA: Diagnosis not present

## 2023-10-31 ENCOUNTER — Emergency Department (HOSPITAL_BASED_OUTPATIENT_CLINIC_OR_DEPARTMENT_OTHER)
Admission: EM | Admit: 2023-10-31 | Discharge: 2023-10-31 | Disposition: A | Discharge status: Home / Self Care | Attending: Emergency Medicine | Admitting: Emergency Medicine

## 2023-10-31 ENCOUNTER — Encounter (HOSPITAL_BASED_OUTPATIENT_CLINIC_OR_DEPARTMENT_OTHER): Payer: Self-pay

## 2023-10-31 ENCOUNTER — Other Ambulatory Visit: Payer: Self-pay

## 2023-10-31 DIAGNOSIS — W06XXXA Fall from bed, initial encounter: Secondary | ICD-10-CM | POA: Diagnosis not present

## 2023-10-31 DIAGNOSIS — S0181XA Laceration without foreign body of other part of head, initial encounter: Secondary | ICD-10-CM | POA: Diagnosis not present

## 2023-10-31 DIAGNOSIS — S0993XA Unspecified injury of face, initial encounter: Secondary | ICD-10-CM | POA: Diagnosis present

## 2023-10-31 MED ORDER — LIDOCAINE-EPINEPHRINE (PF) 2 %-1:200000 IJ SOLN
10.0000 mL | Freq: Once | INTRAMUSCULAR | Status: AC
  Administered 2023-10-31: 10 mL
  Filled 2023-10-31: qty 20

## 2023-10-31 NOTE — ED Triage Notes (Signed)
 Per pt she rolled out of the bed in the middle of the night and hit her chin on her bedside table. States it happened about 1:30 am. Denies n/v/dizziness, did not lose consciousness when it happened. Hx of parkinsons.

## 2023-10-31 NOTE — ED Provider Notes (Signed)
 [place] [facility] Provider Note   CSN: [id number] Arrival date & time: 10/31/23  9276     Patient presents with: Brooke Frazier   Brooke Frazier is a 78 y.o. female.   Patient presents to the emergency department for evaluation of chin injury.  Patient reports that she fell out of bed overnight, thinks she might of hit her chin on the bedside table.  Tetanus is up-to-date.  She does report that she had a very slight headache initially but this has resolved.  Able to open and close her mouth without difficulty, no dental injury.       Prior to Admission medications   Medication Sig Start Date End Date Taking? Authorizing Provider  amantadine (SYMMETREL) 100 MG capsule Take 100 mg by mouth 2 (two) times daily.    [provider]  carbidopa-levodopa (SINEMET IR) 25-100 MG tablet Take 1 tablet by mouth 3 (three) times daily.    [provider]  MELOXICAM PO Take by mouth.    [provider]  RANITIDINE HCL PO Take by mouth.    [provider]    Allergies: Patient has no known allergies.    Review of Systems  Updated Vital Signs BP (!) 181/98   Pulse 67   Temp 97.7 F (36.5 C) (Oral)   Resp 17   Ht 5' 5 (1.651 m)   Wt 74.8 kg   SpO2 98%   BMI 27.46 kg/m   Physical Exam Vitals and nursing note reviewed.  Constitutional:      Appearance: Normal appearance.  HENT:     Head: Laceration (Submental) present.     Jaw: There is normal jaw occlusion. No tenderness, pain on movement or malocclusion.  Eyes:     Pupils: Pupils are equal, round, and reactive to light.  Cardiovascular:     Rate and Rhythm: Normal rate and regular rhythm.  Pulmonary:     Effort: Pulmonary effort is normal.     Breath sounds: Normal breath sounds.  Musculoskeletal:        General: No tenderness. Normal range of motion.  Neurological:     General: No focal deficit present.     Mental Status: She is alert.  Psychiatric:         Mood and Affect: Mood normal.     (all labs ordered are listed, but only abnormal results are displayed) Labs Reviewed - No data to display  EKG: None  Radiology: No results found.   .Laceration Repair  Date/Time: 10/31/2023 8:01 AM  Performed by: Haze Lonni JINNY, MD Authorized by: Haze Lonni JINNY, MD   Consent:    Consent obtained:  Verbal   Consent given by:  Patient   Risks, benefits, and alternatives were discussed: yes     Risks discussed:  Infection, pain, retained foreign body and poor cosmetic result Universal protocol:    Procedure explained and questions answered to patient or proxy's satisfaction: yes     Required blood products, implants, devices, and special equipment available: yes     Site/side marked: yes     Immediately prior to procedure, a time out was called: yes     Patient identity confirmed:  Verbally with patient Anesthesia:    Anesthesia method:  Local infiltration   Local anesthetic:  Lidocaine  2% WITH epi Laceration details:    Location:  Face   Face location:  Chin   Length (cm):  1.5 Exploration:    Limited defect  created (wound extended): no     Hemostasis achieved with:  Direct pressure   Wound exploration: entire depth of wound visualized     Contaminated: no   Treatment:    Area cleansed with:  Chlorhexidine   Amount of cleaning:  Standard   Irrigation solution:  Sterile saline   Irrigation method:  Syringe   Debridement:  None Skin repair:    Repair method:  Sutures   Suture size:  5-0   Suture material:  Nylon   Suture technique:  Simple interrupted   Number of sutures:  3 Approximation:    Approximation:  Close Repair type:    Repair type:  Simple Post-procedure details:    Dressing:  Open (no dressing)   Procedure completion:  Tolerated well, no immediate complications    Medications Ordered in the ED  lidocaine -EPINEPHrine (XYLOCAINE  W/EPI) 2 %-1:200000 (PF) injection 10 mL (10 mLs Infiltration  Given by Other 10/31/23 9257)                                    Medical Decision Making Risk Prescription drug management.   Presents with laceration under chin after falling out of bed.  Tetanus is up-to-date.  Examination does not raise any concern for mandibular fracture.  No malocclusion, no dental injury.  Able to open and close mouth without any difficulty.  Med list reviewed, she is not on blood thinners.  She does not currently have a headache, no neurologic deficits.  Patient has a deep brain stimulator.  She does not have the device to turn off the stimulator.  She therefore cannot get a CT scan at this time and I do not see any significant indication for imaging at this time.  Laceration repaired.  Will return with stimulator remote if she has any new neurologic symptoms or worsening headache.     Final diagnoses:  None    ED Discharge Orders     None          Monchel Pollitt, Lonni PARAS, MD 10/31/23 [phone]

## 2023-11-09 DIAGNOSIS — Z6830 Body mass index (BMI) 30.0-30.9, adult: Secondary | ICD-10-CM | POA: Diagnosis not present

## 2023-11-09 DIAGNOSIS — T148XXA Other injury of unspecified body region, initial encounter: Secondary | ICD-10-CM | POA: Diagnosis not present

## 2023-11-09 DIAGNOSIS — Z4802 Encounter for removal of sutures: Secondary | ICD-10-CM | POA: Diagnosis not present

## 2023-11-14 DIAGNOSIS — Z604 Social exclusion and rejection: Secondary | ICD-10-CM | POA: Diagnosis not present

## 2023-11-14 DIAGNOSIS — F0283 Dementia in other diseases classified elsewhere, unspecified severity, with mood disturbance: Secondary | ICD-10-CM | POA: Diagnosis not present

## 2023-11-14 DIAGNOSIS — Z9181 History of falling: Secondary | ICD-10-CM | POA: Diagnosis not present

## 2023-11-14 DIAGNOSIS — G20A1 Parkinson's disease without dyskinesia, without mention of fluctuations: Secondary | ICD-10-CM | POA: Diagnosis not present

## 2023-11-14 DIAGNOSIS — Z7983 Long term (current) use of bisphosphonates: Secondary | ICD-10-CM | POA: Diagnosis not present

## 2023-11-14 DIAGNOSIS — M810 Age-related osteoporosis without current pathological fracture: Secondary | ICD-10-CM | POA: Diagnosis not present

## 2023-11-14 DIAGNOSIS — R1032 Left lower quadrant pain: Secondary | ICD-10-CM | POA: Diagnosis not present

## 2023-11-14 DIAGNOSIS — F325 Major depressive disorder, single episode, in full remission: Secondary | ICD-10-CM | POA: Diagnosis not present

## 2023-11-14 DIAGNOSIS — M545 Low back pain, unspecified: Secondary | ICD-10-CM | POA: Diagnosis not present

## 2023-11-14 DIAGNOSIS — I10 Essential (primary) hypertension: Secondary | ICD-10-CM | POA: Diagnosis not present

## 2023-11-14 DIAGNOSIS — E7800 Pure hypercholesterolemia, unspecified: Secondary | ICD-10-CM | POA: Diagnosis not present

## 2023-11-21 DIAGNOSIS — Z4589 Encounter for adjustment and management of other implanted devices: Secondary | ICD-10-CM | POA: Diagnosis not present

## 2023-11-21 DIAGNOSIS — G20A1 Parkinson's disease without dyskinesia, without mention of fluctuations: Secondary | ICD-10-CM | POA: Diagnosis not present

## 2023-12-03 ENCOUNTER — Other Ambulatory Visit: Payer: Self-pay | Admitting: Cardiology

## 2023-12-03 DIAGNOSIS — B353 Tinea pedis: Secondary | ICD-10-CM | POA: Diagnosis not present

## 2023-12-03 DIAGNOSIS — M79672 Pain in left foot: Secondary | ICD-10-CM | POA: Diagnosis not present

## 2023-12-03 DIAGNOSIS — M79671 Pain in right foot: Secondary | ICD-10-CM | POA: Diagnosis not present

## 2023-12-03 DIAGNOSIS — B351 Tinea unguium: Secondary | ICD-10-CM | POA: Diagnosis not present

## 2023-12-03 DIAGNOSIS — M2011 Hallux valgus (acquired), right foot: Secondary | ICD-10-CM | POA: Diagnosis not present

## 2023-12-03 DIAGNOSIS — M2012 Hallux valgus (acquired), left foot: Secondary | ICD-10-CM | POA: Diagnosis not present
